# Patient Record
Sex: Male | Born: 1935 | Race: White | Hispanic: No | Marital: Married | Smoking: Former smoker
Health system: Southern US, Community
[De-identification: ages and names within clinical notes are randomized; demographics above are authoritative.]

## PROBLEM LIST (undated history)

## (undated) DIAGNOSIS — F329 Major depressive disorder, single episode, unspecified: Secondary | ICD-10-CM

## (undated) DIAGNOSIS — N200 Calculus of kidney: Secondary | ICD-10-CM

## (undated) DIAGNOSIS — E785 Hyperlipidemia, unspecified: Secondary | ICD-10-CM

## (undated) DIAGNOSIS — A4101 Sepsis due to Methicillin susceptible Staphylococcus aureus: Secondary | ICD-10-CM

## (undated) DIAGNOSIS — N138 Other obstructive and reflux uropathy: Secondary | ICD-10-CM

## (undated) DIAGNOSIS — K7689 Other specified diseases of liver: Secondary | ICD-10-CM

## (undated) DIAGNOSIS — M7021 Olecranon bursitis, right elbow: Secondary | ICD-10-CM

## (undated) DIAGNOSIS — I714 Abdominal aortic aneurysm, without rupture, unspecified: Secondary | ICD-10-CM

## (undated) DIAGNOSIS — R972 Elevated prostate specific antigen [PSA]: Secondary | ICD-10-CM

## (undated) DIAGNOSIS — I719 Aortic aneurysm of unspecified site, without rupture: Secondary | ICD-10-CM

## (undated) DIAGNOSIS — B9561 Methicillin susceptible Staphylococcus aureus infection as the cause of diseases classified elsewhere: Secondary | ICD-10-CM

## (undated) DIAGNOSIS — Z8679 Personal history of other diseases of the circulatory system: Secondary | ICD-10-CM

## (undated) DIAGNOSIS — F0390 Unspecified dementia without behavioral disturbance: Secondary | ICD-10-CM

## (undated) DIAGNOSIS — R269 Unspecified abnormalities of gait and mobility: Secondary | ICD-10-CM

## (undated) DIAGNOSIS — N12 Tubulo-interstitial nephritis, not specified as acute or chronic: Secondary | ICD-10-CM

## (undated) DIAGNOSIS — R7881 Bacteremia: Secondary | ICD-10-CM

## (undated) DIAGNOSIS — G4700 Insomnia, unspecified: Secondary | ICD-10-CM

## (undated) DIAGNOSIS — R011 Cardiac murmur, unspecified: Secondary | ICD-10-CM

## (undated) DIAGNOSIS — J984 Other disorders of lung: Secondary | ICD-10-CM

## (undated) DIAGNOSIS — N401 Enlarged prostate with lower urinary tract symptoms: Secondary | ICD-10-CM

## (undated) DIAGNOSIS — N281 Cyst of kidney, acquired: Secondary | ICD-10-CM

## (undated) DIAGNOSIS — N201 Calculus of ureter: Secondary | ICD-10-CM

## (undated) DIAGNOSIS — R350 Frequency of micturition: Secondary | ICD-10-CM

## (undated) DIAGNOSIS — S42023A Displaced fracture of shaft of unspecified clavicle, initial encounter for closed fracture: Secondary | ICD-10-CM

## (undated) DIAGNOSIS — Z95828 Presence of other vascular implants and grafts: Secondary | ICD-10-CM

## (undated) DIAGNOSIS — Z981 Arthrodesis status: Secondary | ICD-10-CM

## (undated) DIAGNOSIS — Z87898 Personal history of other specified conditions: Secondary | ICD-10-CM

## (undated) DIAGNOSIS — S2231XA Fracture of one rib, right side, initial encounter for closed fracture: Secondary | ICD-10-CM

## (undated) DIAGNOSIS — J441 Chronic obstructive pulmonary disease with (acute) exacerbation: Secondary | ICD-10-CM

## (undated) DIAGNOSIS — I2510 Atherosclerotic heart disease of native coronary artery without angina pectoris: Secondary | ICD-10-CM

## (undated) DIAGNOSIS — M71121 Other infective bursitis, right elbow: Secondary | ICD-10-CM

## (undated) HISTORY — DX: Unspecified abnormalities of gait and mobility: R26.9

## (undated) HISTORY — DX: Other specified diseases of liver: K76.89

## (undated) HISTORY — PX: LITHOTRIPSY: SUR834

## (undated) HISTORY — DX: Major depressive disorder, single episode, unspecified: F32.9

## (undated) HISTORY — DX: Cardiac murmur, unspecified: R01.1

## (undated) HISTORY — DX: Other infective bursitis, right elbow: M71.121

## (undated) HISTORY — DX: Other disorders of lung: J98.4

## (undated) HISTORY — DX: Sepsis due to methicillin susceptible Staphylococcus aureus: A41.01

## (undated) HISTORY — DX: Other obstructive and reflux uropathy: N13.8

## (undated) HISTORY — DX: Tubulo-interstitial nephritis, not specified as acute or chronic: N12

## (undated) HISTORY — DX: Unspecified dementia without behavioral disturbance: F03.90

## (undated) HISTORY — DX: Chronic obstructive pulmonary disease with (acute) exacerbation: J44.1

## (undated) HISTORY — DX: Personal history of other specified conditions: Z87.898

## (undated) HISTORY — DX: Elevated prostate specific antigen (PSA): R97.20

## (undated) HISTORY — DX: Displaced fracture of shaft of unspecified clavicle, initial encounter for closed fracture: S42.023A

## (undated) HISTORY — DX: Bacteremia: R78.81

## (undated) HISTORY — DX: Personal history of other diseases of the circulatory system: Z86.79

## (undated) HISTORY — DX: Olecranon bursitis, right elbow: M70.21

## (undated) HISTORY — DX: Arthrodesis status: Z98.1

## (undated) HISTORY — DX: Atherosclerotic heart disease of native coronary artery without angina pectoris: I25.10

## (undated) HISTORY — DX: Benign prostatic hyperplasia with lower urinary tract symptoms: N40.1

## (undated) HISTORY — DX: Calculus of ureter: N20.1

## (undated) HISTORY — DX: Presence of other vascular implants and grafts: Z95.828

## (undated) HISTORY — DX: Cyst of kidney, acquired: N28.1

## (undated) HISTORY — DX: Abdominal aortic aneurysm, without rupture: I71.4

## (undated) HISTORY — PX: APPENDECTOMY: SHX54

## (undated) HISTORY — DX: Methicillin susceptible Staphylococcus aureus infection as the cause of diseases classified elsewhere: B95.61

## (undated) HISTORY — DX: Calculus of kidney: N20.0

## (undated) HISTORY — PX: ABDOMINAL AORTIC ANEURYSM REPAIR: SUR1152

## (undated) HISTORY — DX: Fracture of one rib, right side, initial encounter for closed fracture: S22.31XA

## (undated) HISTORY — DX: Abdominal aortic aneurysm, without rupture, unspecified: I71.40

## (undated) HISTORY — DX: Insomnia, unspecified: G47.00

## (undated) HISTORY — DX: Frequency of micturition: R35.0

---

## 2002-08-22 ENCOUNTER — Encounter: Payer: Self-pay | Admitting: *Deleted

## 2002-08-22 ENCOUNTER — Emergency Department (HOSPITAL_COMMUNITY): Admission: EM | Admit: 2002-08-22 | Discharge: 2002-08-22 | Payer: Self-pay

## 2006-09-19 HISTORY — PX: CERVICAL DISC ARTHROPLASTY: SHX587

## 2008-09-19 HISTORY — PX: KIDNEY STONE SURGERY: SHX686

## 2009-10-29 ENCOUNTER — Ambulatory Visit: Payer: Self-pay | Admitting: Family Medicine

## 2009-10-29 DIAGNOSIS — E785 Hyperlipidemia, unspecified: Secondary | ICD-10-CM | POA: Insufficient documentation

## 2009-10-29 DIAGNOSIS — S61409A Unspecified open wound of unspecified hand, initial encounter: Secondary | ICD-10-CM | POA: Insufficient documentation

## 2010-10-19 NOTE — Assessment & Plan Note (Signed)
Summary: LACERATION TO HAND/KH   Vital Signs:  Patient Profile:   75 Years Old Male CC:      lac right hand last night working on a cabinet Height:     68 inches Weight:      165 pounds O2 Sat:      97 % O2 treatment:    Room Air Temp:     98.5 degrees F oral Pulse rate:   64 / minute Pulse rhythm:   regular Resp:     12 per minute BP sitting:   147 / 84  (right arm) Cuff size:   regular  Vitals Entered By: Emilio Math (October 29, 2009 11:05 AM)                  Current Allergies: No known allergies History of Present Illness Chief Complaint: lac right hand last night working on a cabinet History of Present Illness: CUT RIGHT HAND ON A CABINET LAST PM. WOUND IS OVER 18 HRS OLD. COULD HAVE USED SUTUREING. BLEEDING CONTROLLED. NO NUMBNESS OR TINGLING.   Current Meds LIPITOR 20 MG TABS (ATORVASTATIN CALCIUM)  UROXATRAL 10 MG XR24H-TAB (ALFUZOSIN HCL)  CEPHALEXIN 500 MG CAPS (CEPHALEXIN) 1 by mouth tid  REVIEW OF SYSTEMS Constitutional Symptoms      Denies fever, chills, night sweats, weight loss, weight gain, and fatigue.  Eyes       Denies change in vision, eye pain, eye discharge, glasses, contact lenses, and eye surgery. Ear/Nose/Throat/Mouth       Denies hearing loss/aids, change in hearing, ear pain, ear discharge, dizziness, frequent runny nose, frequent nose bleeds, sinus problems, sore throat, hoarseness, and tooth pain or bleeding.  Respiratory       Denies dry cough, productive cough, wheezing, shortness of breath, asthma, bronchitis, and emphysema/COPD.  Cardiovascular       Denies murmurs, chest pain, and tires easily with exhertion.    Gastrointestinal       Denies stomach pain, nausea/vomiting, diarrhea, constipation, blood in bowel movements, and indigestion. Genitourniary       Denies painful urination, kidney stones, and loss of urinary control. Neurological       Denies paralysis, seizures, and fainting/blackouts. Musculoskeletal       Denies  muscle pain, joint pain, joint stiffness, decreased range of motion, redness, swelling, muscle weakness, and gout.  Skin       Denies bruising, unusual mles/lumps or sores, and hair/skin or nail changes.  Psych       Denies mood changes, temper/anger issues, anxiety/stress, speech problems, depression, and sleep problems.  Past History:  Past Medical History: Hyperlipidemia  Past Surgical History: Neck surgery  Family History: Mother, Healthy Father,D@ 37 Stomach CA  Social History: Non-smoker ETOH-yes No Drugs Retired from [employer] Physical Exam Skin: 4 CM IRREG FULL SKIN THIKNESS LACERATION THENAR IMMINENCE FOR THE RIGHT HAND. BLEEDING CONTROLLED. N/V INTACT. ROM OF THE THUMB INTACT WOUND SOAKED IN HIBIACLEANS AND IRRIGATED. DUE TO THE AGE OF THE WOUND ELECT NOT TO SUTURE. STERISTRIPS APPLIED. KLING DRESSING.  Assessment New Problems: LACERATION, HAND, RIGHT (ICD-882.0) HYPERLIPIDEMIA (ICD-272.4)   Plan New Medications/Changes: CEPHALEXIN 500 MG CAPS (CEPHALEXIN) 1 by mouth tid  ##21 0 x 0, 10/29/2009, Marvis Moeller DO  New Orders: New Patient Level III [99203]   Prescriptions: CEPHALEXIN 500 MG CAPS (CEPHALEXIN) 1 by mouth tid  ##21 0 x 0   Entered and Authorized by:   Marvis Moeller DO   Signed by:   Marvis Moeller DO on 10/29/2009  Method used:   Print then Give to Patient   RxID:   [id number]   Patient Instructions: 1)  KEEP CLEAN AND DRY. MONITOR FOR SIGNS OF INECTION. WILL TAKE 2 WKS OR MORE TO HEAL. MINIMAL USE. FOLLOW UP IF ANY COMPLICATIONS.   Orders Added: 1)  New Patient Level III [99203]    Tetanus/Td Vaccine    Vaccine Type: Td    Site: left deltoid    Mfr: Decavac    Dose: 0.5 ml    Route: IM    Given by: Emilio Math    Exp. Date: 12/23/2010    Lot #: [id number]    VIS given: 08/07/07 version given October 29, 2009.

## 2011-02-04 LAB — HM COLONOSCOPY

## 2011-12-09 DIAGNOSIS — N138 Other obstructive and reflux uropathy: Secondary | ICD-10-CM

## 2011-12-09 HISTORY — DX: Other obstructive and reflux uropathy: N13.8

## 2012-06-18 DIAGNOSIS — E785 Hyperlipidemia, unspecified: Secondary | ICD-10-CM

## 2012-06-18 DIAGNOSIS — R011 Cardiac murmur, unspecified: Secondary | ICD-10-CM

## 2012-06-18 DIAGNOSIS — Z981 Arthrodesis status: Secondary | ICD-10-CM

## 2012-06-18 DIAGNOSIS — F32A Depression, unspecified: Secondary | ICD-10-CM

## 2012-06-18 HISTORY — DX: Depression, unspecified: F32.A

## 2012-06-18 HISTORY — DX: Cardiac murmur, unspecified: R01.1

## 2012-06-18 HISTORY — DX: Hyperlipidemia, unspecified: E78.5

## 2012-06-18 HISTORY — DX: Arthrodesis status: Z98.1

## 2013-11-11 DIAGNOSIS — N200 Calculus of kidney: Secondary | ICD-10-CM | POA: Diagnosis not present

## 2013-11-11 DIAGNOSIS — N401 Enlarged prostate with lower urinary tract symptoms: Secondary | ICD-10-CM | POA: Diagnosis not present

## 2013-11-11 DIAGNOSIS — R972 Elevated prostate specific antigen [PSA]: Secondary | ICD-10-CM | POA: Diagnosis not present

## 2013-11-11 DIAGNOSIS — N139 Obstructive and reflux uropathy, unspecified: Secondary | ICD-10-CM | POA: Diagnosis not present

## 2013-11-11 DIAGNOSIS — N138 Other obstructive and reflux uropathy: Secondary | ICD-10-CM | POA: Diagnosis not present

## 2013-12-03 DIAGNOSIS — E7800 Pure hypercholesterolemia, unspecified: Secondary | ICD-10-CM | POA: Diagnosis not present

## 2014-02-05 DIAGNOSIS — R21 Rash and other nonspecific skin eruption: Secondary | ICD-10-CM | POA: Diagnosis not present

## 2014-02-05 DIAGNOSIS — L299 Pruritus, unspecified: Secondary | ICD-10-CM | POA: Diagnosis not present

## 2014-02-26 DIAGNOSIS — L819 Disorder of pigmentation, unspecified: Secondary | ICD-10-CM | POA: Diagnosis not present

## 2014-03-05 DIAGNOSIS — Z79899 Other long term (current) drug therapy: Secondary | ICD-10-CM | POA: Diagnosis not present

## 2014-03-05 DIAGNOSIS — E7800 Pure hypercholesterolemia, unspecified: Secondary | ICD-10-CM | POA: Diagnosis not present

## 2014-03-11 DIAGNOSIS — I714 Abdominal aortic aneurysm, without rupture, unspecified: Secondary | ICD-10-CM | POA: Diagnosis not present

## 2014-03-11 DIAGNOSIS — Z48812 Encounter for surgical aftercare following surgery on the circulatory system: Secondary | ICD-10-CM | POA: Diagnosis not present

## 2014-03-11 DIAGNOSIS — I701 Atherosclerosis of renal artery: Secondary | ICD-10-CM | POA: Diagnosis not present

## 2014-03-11 DIAGNOSIS — Z9889 Other specified postprocedural states: Secondary | ICD-10-CM | POA: Diagnosis not present

## 2014-03-11 DIAGNOSIS — Z95828 Presence of other vascular implants and grafts: Secondary | ICD-10-CM | POA: Diagnosis not present

## 2014-03-12 DIAGNOSIS — E7800 Pure hypercholesterolemia, unspecified: Secondary | ICD-10-CM | POA: Diagnosis not present

## 2014-04-02 DIAGNOSIS — H2510 Age-related nuclear cataract, unspecified eye: Secondary | ICD-10-CM | POA: Diagnosis not present

## 2014-04-02 DIAGNOSIS — H524 Presbyopia: Secondary | ICD-10-CM | POA: Diagnosis not present

## 2014-06-10 DIAGNOSIS — E7800 Pure hypercholesterolemia, unspecified: Secondary | ICD-10-CM | POA: Diagnosis not present

## 2014-06-19 DIAGNOSIS — Z23 Encounter for immunization: Secondary | ICD-10-CM | POA: Diagnosis not present

## 2014-06-28 ENCOUNTER — Emergency Department (INDEPENDENT_AMBULATORY_CARE_PROVIDER_SITE_OTHER): Payer: Medicare Other

## 2014-06-28 ENCOUNTER — Encounter: Payer: Self-pay | Admitting: Emergency Medicine

## 2014-06-28 ENCOUNTER — Emergency Department (INDEPENDENT_AMBULATORY_CARE_PROVIDER_SITE_OTHER)
Admission: EM | Admit: 2014-06-28 | Discharge: 2014-06-28 | Disposition: A | Discharge status: Home / Self Care | Payer: Medicare Other | Source: Home / Self Care | Attending: Family Medicine | Admitting: Family Medicine

## 2014-06-28 DIAGNOSIS — M25421 Effusion, right elbow: Secondary | ICD-10-CM | POA: Diagnosis not present

## 2014-06-28 DIAGNOSIS — M25521 Pain in right elbow: Secondary | ICD-10-CM

## 2014-06-28 DIAGNOSIS — M7021 Olecranon bursitis, right elbow: Secondary | ICD-10-CM | POA: Diagnosis not present

## 2014-06-28 DIAGNOSIS — M71121 Other infective bursitis, right elbow: Secondary | ICD-10-CM

## 2014-06-28 DIAGNOSIS — Z23 Encounter for immunization: Secondary | ICD-10-CM

## 2014-06-28 DIAGNOSIS — W19XXXA Unspecified fall, initial encounter: Secondary | ICD-10-CM

## 2014-06-28 DIAGNOSIS — Y92009 Unspecified place in unspecified non-institutional (private) residence as the place of occurrence of the external cause: Secondary | ICD-10-CM

## 2014-06-28 HISTORY — DX: Aortic aneurysm of unspecified site, without rupture: I71.9

## 2014-06-28 HISTORY — DX: Hyperlipidemia, unspecified: E78.5

## 2014-06-28 HISTORY — DX: Calculus of kidney: N20.0

## 2014-06-28 MED ORDER — TETANUS-DIPHTH-ACELL PERTUSSIS 5-2.5-18.5 LF-MCG/0.5 IM SUSP
0.5000 mL | Freq: Once | INTRAMUSCULAR | Status: AC
  Administered 2014-06-28: 0.5 mL via INTRAMUSCULAR

## 2014-06-28 NOTE — Discharge Instructions (Signed)
Thank you for coming in today. Go directly to [facility] emergency room. Do not eat or drink anything.

## 2014-06-28 NOTE — ED Provider Notes (Signed)
Michael Dillon is a 78 y.o. male who presents to Urgent Care today for arm swelling and elbow discharge. Patient fell 3 weeks ago striking his elbow on the ground resulting in a laceration at the olecranon. He's had clear drainage from the laceration at the olecranon until yesterday. He tripped yesterday and fell again landing on his elbow. He notes a continuous serous sanguinous discharge from the olecranon at the same location. Additionally he notes some elbow pain and swelling distal to the elbow of his arm. He denies any wrist or hand pain weakness or numbness. No fevers or chills. No medications tried yet. He cannot recall his last tetanus vaccination.   Past Medical History  Diagnosis Date  . Aortic aneurysm     2012  . Hyperlipidemia   . Renal calculi    History  Substance Use Topics  . Smoking status: Never Smoker   . Smokeless tobacco: Not on file  . Alcohol Use: Yes   ROS as above Medications: No current facility-administered medications for this encounter.   Current Outpatient Prescriptions  Medication Sig Dispense Refill  . atorvastatin (LIPITOR) 20 MG tablet Take 20 mg by mouth daily.      . clopidogrel (PLAVIX) 75 MG tablet Take 75 mg by mouth daily.        Exam:  BP 136/71  Pulse 77  Temp(Src) 97.8 F (36.6 C) (Oral)  Resp 16  SpO2 95% Gen: Well NAD HEENT: EOMI,  MMM Lungs: Normal work of breathing. CTABL Heart: RRR no MRG Abd: NABS, Soft. Nondistended, Nontender Exts: Brisk capillary refill, warm and well perfused.  Right arm: Swollen from the elbow to the MCPs. Wrist motion and hand motion are intact. The wrist and hand are nontender. Grip strength is intact and normal. Pulses are intact distally. The forearm is nontender but swollen. No palpable areas of fluctuance or induration. The elbow has normal range of motion but it is swollen especially on the olecranon aspect. There is a small hole at the tip of the olecranon with expressible serosanguineous fluid.  There is mild tenderness in the area.   No results found for this or any previous visit (from the past 24 hour(s)). Dg Elbow Complete Right  06/28/2014   CLINICAL DATA:  Status post fall 3 weeks ago and last night with posterior right elbow pain.  EXAM: RIGHT ELBOW - COMPLETE 3+ VIEW  COMPARISON:  None.  FINDINGS: There is no evidence of fracture, dislocation, or joint effusion. There is no evidence of arthropathy or other focal bone abnormality. Soft tissues are unremarkable.  IMPRESSION: No acute fracture or dislocation.   Electronically Signed   By: Sherian ReinWei-Chen  Lin M.D.   On: 06/28/2014 15:34    Assessment and Plan: 78 y.o. male with infected traumatic olecranon bursitis of the right elbow. Patient has distal swelling and I am concerned about more extensive infection. I discussed the case with the on-call orthopedic surgeon [facility]. The surgeon recommends the patient go to the emergency room now for further evaluation and management.  Wound Culture is Pending.   Discussed warning signs or symptoms. Please see discharge instructions. Patient expresses understanding.     Michael BongEvan S Tyquan Carmickle, MD 06/28/14 [id number]

## 2014-06-28 NOTE — ED Notes (Signed)
Correction: Apparently right elbow laceration occurred 3 weeks ago when he tripped on curb and fell against concrete/asphalt. Has been drain ever since. Last Tdap unknown.

## 2014-06-28 NOTE — ED Notes (Signed)
Fell while going down steps last evening and awoke with noticeable swelling of right hand and forearm; somewhat painful when folded across chest to sleep last night; also lacerated right elbow and bumped head; twisted lower back.

## 2014-06-29 DIAGNOSIS — L03113 Cellulitis of right upper limb: Secondary | ICD-10-CM | POA: Diagnosis not present

## 2014-06-29 DIAGNOSIS — E785 Hyperlipidemia, unspecified: Secondary | ICD-10-CM | POA: Diagnosis not present

## 2014-06-29 DIAGNOSIS — Z87891 Personal history of nicotine dependence: Secondary | ICD-10-CM | POA: Diagnosis not present

## 2014-06-29 DIAGNOSIS — N401 Enlarged prostate with lower urinary tract symptoms: Secondary | ICD-10-CM | POA: Diagnosis present

## 2014-06-29 DIAGNOSIS — M7021 Olecranon bursitis, right elbow: Secondary | ICD-10-CM | POA: Diagnosis not present

## 2014-06-29 DIAGNOSIS — Z87442 Personal history of urinary calculi: Secondary | ICD-10-CM | POA: Diagnosis not present

## 2014-06-29 DIAGNOSIS — I10 Essential (primary) hypertension: Secondary | ICD-10-CM | POA: Diagnosis not present

## 2014-06-29 DIAGNOSIS — Z981 Arthrodesis status: Secondary | ICD-10-CM | POA: Diagnosis not present

## 2014-06-29 DIAGNOSIS — M71121 Other infective bursitis, right elbow: Secondary | ICD-10-CM | POA: Diagnosis not present

## 2014-06-29 DIAGNOSIS — L03115 Cellulitis of right lower limb: Secondary | ICD-10-CM | POA: Diagnosis not present

## 2014-06-29 DIAGNOSIS — R296 Repeated falls: Secondary | ICD-10-CM | POA: Diagnosis present

## 2014-06-29 DIAGNOSIS — B9689 Other specified bacterial agents as the cause of diseases classified elsewhere: Secondary | ICD-10-CM | POA: Diagnosis present

## 2014-06-30 DIAGNOSIS — E785 Hyperlipidemia, unspecified: Secondary | ICD-10-CM | POA: Diagnosis not present

## 2014-06-30 DIAGNOSIS — M71121 Other infective bursitis, right elbow: Secondary | ICD-10-CM | POA: Diagnosis not present

## 2014-06-30 DIAGNOSIS — M7021 Olecranon bursitis, right elbow: Secondary | ICD-10-CM | POA: Diagnosis not present

## 2014-06-30 DIAGNOSIS — L03113 Cellulitis of right upper limb: Secondary | ICD-10-CM | POA: Diagnosis not present

## 2014-06-30 DIAGNOSIS — I10 Essential (primary) hypertension: Secondary | ICD-10-CM | POA: Diagnosis not present

## 2014-07-01 DIAGNOSIS — M7021 Olecranon bursitis, right elbow: Secondary | ICD-10-CM | POA: Diagnosis not present

## 2014-07-01 DIAGNOSIS — L03113 Cellulitis of right upper limb: Secondary | ICD-10-CM | POA: Diagnosis not present

## 2014-07-01 DIAGNOSIS — L03115 Cellulitis of right lower limb: Secondary | ICD-10-CM | POA: Diagnosis not present

## 2014-07-02 LAB — WOUND CULTURE
GRAM STAIN: NONE SEEN
GRAM STAIN: NONE SEEN
Organism ID, Bacteria: NO GROWTH

## 2014-07-09 DIAGNOSIS — M7021 Olecranon bursitis, right elbow: Secondary | ICD-10-CM | POA: Diagnosis not present

## 2014-07-15 DIAGNOSIS — M7021 Olecranon bursitis, right elbow: Secondary | ICD-10-CM | POA: Diagnosis not present

## 2014-07-21 DIAGNOSIS — L905 Scar conditions and fibrosis of skin: Secondary | ICD-10-CM | POA: Diagnosis not present

## 2014-07-21 DIAGNOSIS — L309 Dermatitis, unspecified: Secondary | ICD-10-CM | POA: Diagnosis not present

## 2014-07-21 DIAGNOSIS — Z85828 Personal history of other malignant neoplasm of skin: Secondary | ICD-10-CM | POA: Diagnosis not present

## 2014-07-21 DIAGNOSIS — L815 Leukoderma, not elsewhere classified: Secondary | ICD-10-CM | POA: Diagnosis not present

## 2014-08-25 DIAGNOSIS — L570 Actinic keratosis: Secondary | ICD-10-CM | POA: Diagnosis not present

## 2014-09-03 DIAGNOSIS — Z0000 Encounter for general adult medical examination without abnormal findings: Secondary | ICD-10-CM | POA: Diagnosis not present

## 2014-09-03 DIAGNOSIS — Z125 Encounter for screening for malignant neoplasm of prostate: Secondary | ICD-10-CM | POA: Diagnosis not present

## 2014-09-03 DIAGNOSIS — E780 Pure hypercholesterolemia: Secondary | ICD-10-CM | POA: Diagnosis not present

## 2014-09-03 DIAGNOSIS — R5383 Other fatigue: Secondary | ICD-10-CM | POA: Diagnosis not present

## 2014-09-08 DIAGNOSIS — E780 Pure hypercholesterolemia: Secondary | ICD-10-CM | POA: Diagnosis not present

## 2014-09-08 DIAGNOSIS — Z0000 Encounter for general adult medical examination without abnormal findings: Secondary | ICD-10-CM | POA: Diagnosis not present

## 2014-09-23 DIAGNOSIS — I714 Abdominal aortic aneurysm, without rupture: Secondary | ICD-10-CM | POA: Diagnosis not present

## 2014-09-23 DIAGNOSIS — Z9889 Other specified postprocedural states: Secondary | ICD-10-CM | POA: Diagnosis not present

## 2014-09-23 DIAGNOSIS — Z95828 Presence of other vascular implants and grafts: Secondary | ICD-10-CM | POA: Diagnosis not present

## 2014-09-23 DIAGNOSIS — I701 Atherosclerosis of renal artery: Secondary | ICD-10-CM | POA: Diagnosis not present

## 2014-09-23 DIAGNOSIS — Z7982 Long term (current) use of aspirin: Secondary | ICD-10-CM | POA: Diagnosis not present

## 2014-10-15 DIAGNOSIS — N200 Calculus of kidney: Secondary | ICD-10-CM | POA: Insufficient documentation

## 2014-10-15 DIAGNOSIS — I2510 Atherosclerotic heart disease of native coronary artery without angina pectoris: Secondary | ICD-10-CM

## 2014-10-15 DIAGNOSIS — E785 Hyperlipidemia, unspecified: Secondary | ICD-10-CM | POA: Diagnosis not present

## 2014-10-15 HISTORY — DX: Atherosclerotic heart disease of native coronary artery without angina pectoris: I25.10

## 2014-11-17 DIAGNOSIS — I714 Abdominal aortic aneurysm, without rupture: Secondary | ICD-10-CM | POA: Diagnosis not present

## 2014-11-17 DIAGNOSIS — Z87438 Personal history of other diseases of male genital organs: Secondary | ICD-10-CM | POA: Diagnosis not present

## 2014-11-17 DIAGNOSIS — N281 Cyst of kidney, acquired: Secondary | ICD-10-CM | POA: Diagnosis not present

## 2014-11-17 DIAGNOSIS — R972 Elevated prostate specific antigen [PSA]: Secondary | ICD-10-CM | POA: Diagnosis not present

## 2014-11-17 DIAGNOSIS — N200 Calculus of kidney: Secondary | ICD-10-CM | POA: Diagnosis not present

## 2014-11-17 DIAGNOSIS — N400 Enlarged prostate without lower urinary tract symptoms: Secondary | ICD-10-CM | POA: Diagnosis not present

## 2014-11-17 DIAGNOSIS — F329 Major depressive disorder, single episode, unspecified: Secondary | ICD-10-CM | POA: Diagnosis not present

## 2014-11-17 DIAGNOSIS — Z7982 Long term (current) use of aspirin: Secondary | ICD-10-CM | POA: Diagnosis not present

## 2014-11-17 DIAGNOSIS — E785 Hyperlipidemia, unspecified: Secondary | ICD-10-CM | POA: Diagnosis not present

## 2014-11-17 DIAGNOSIS — Z87891 Personal history of nicotine dependence: Secondary | ICD-10-CM | POA: Diagnosis not present

## 2014-11-24 DIAGNOSIS — L570 Actinic keratosis: Secondary | ICD-10-CM | POA: Diagnosis not present

## 2015-04-06 DIAGNOSIS — H2513 Age-related nuclear cataract, bilateral: Secondary | ICD-10-CM | POA: Diagnosis not present

## 2015-04-06 DIAGNOSIS — H5203 Hypermetropia, bilateral: Secondary | ICD-10-CM | POA: Diagnosis not present

## 2015-04-06 DIAGNOSIS — Z135 Encounter for screening for eye and ear disorders: Secondary | ICD-10-CM | POA: Diagnosis not present

## 2015-04-06 DIAGNOSIS — H43811 Vitreous degeneration, right eye: Secondary | ICD-10-CM | POA: Diagnosis not present

## 2015-04-25 DIAGNOSIS — I9789 Other postprocedural complications and disorders of the circulatory system, not elsewhere classified: Secondary | ICD-10-CM | POA: Diagnosis not present

## 2015-04-25 DIAGNOSIS — K5900 Constipation, unspecified: Secondary | ICD-10-CM | POA: Diagnosis not present

## 2015-04-25 DIAGNOSIS — R4182 Altered mental status, unspecified: Secondary | ICD-10-CM | POA: Diagnosis not present

## 2015-04-25 DIAGNOSIS — R410 Disorientation, unspecified: Secondary | ICD-10-CM | POA: Diagnosis not present

## 2015-04-25 DIAGNOSIS — G9341 Metabolic encephalopathy: Secondary | ICD-10-CM | POA: Diagnosis not present

## 2015-04-25 DIAGNOSIS — E785 Hyperlipidemia, unspecified: Secondary | ICD-10-CM | POA: Diagnosis not present

## 2015-04-25 DIAGNOSIS — I714 Abdominal aortic aneurysm, without rupture: Secondary | ICD-10-CM | POA: Diagnosis not present

## 2015-04-25 DIAGNOSIS — I4510 Unspecified right bundle-branch block: Secondary | ICD-10-CM | POA: Diagnosis not present

## 2015-04-25 DIAGNOSIS — D72829 Elevated white blood cell count, unspecified: Secondary | ICD-10-CM | POA: Diagnosis not present

## 2015-04-25 DIAGNOSIS — K750 Abscess of liver: Secondary | ICD-10-CM | POA: Diagnosis not present

## 2015-04-25 DIAGNOSIS — R55 Syncope and collapse: Secondary | ICD-10-CM | POA: Diagnosis not present

## 2015-04-26 DIAGNOSIS — R55 Syncope and collapse: Secondary | ICD-10-CM | POA: Diagnosis not present

## 2015-04-26 DIAGNOSIS — I4510 Unspecified right bundle-branch block: Secondary | ICD-10-CM | POA: Diagnosis present

## 2015-04-26 DIAGNOSIS — D72829 Elevated white blood cell count, unspecified: Secondary | ICD-10-CM | POA: Diagnosis present

## 2015-04-26 DIAGNOSIS — K7689 Other specified diseases of liver: Secondary | ICD-10-CM | POA: Diagnosis not present

## 2015-04-26 DIAGNOSIS — E785 Hyperlipidemia, unspecified: Secondary | ICD-10-CM | POA: Diagnosis not present

## 2015-04-26 DIAGNOSIS — K769 Liver disease, unspecified: Secondary | ICD-10-CM | POA: Diagnosis not present

## 2015-04-26 DIAGNOSIS — Z87891 Personal history of nicotine dependence: Secondary | ICD-10-CM | POA: Diagnosis not present

## 2015-04-26 DIAGNOSIS — K5900 Constipation, unspecified: Secondary | ICD-10-CM | POA: Diagnosis not present

## 2015-04-26 DIAGNOSIS — I517 Cardiomegaly: Secondary | ICD-10-CM | POA: Diagnosis not present

## 2015-04-26 DIAGNOSIS — R109 Unspecified abdominal pain: Secondary | ICD-10-CM | POA: Diagnosis not present

## 2015-04-26 DIAGNOSIS — R410 Disorientation, unspecified: Secondary | ICD-10-CM | POA: Diagnosis present

## 2015-04-26 DIAGNOSIS — N400 Enlarged prostate without lower urinary tract symptoms: Secondary | ICD-10-CM | POA: Diagnosis not present

## 2015-04-26 DIAGNOSIS — N401 Enlarged prostate with lower urinary tract symptoms: Secondary | ICD-10-CM | POA: Diagnosis not present

## 2015-04-26 DIAGNOSIS — I9789 Other postprocedural complications and disorders of the circulatory system, not elsewhere classified: Secondary | ICD-10-CM | POA: Diagnosis present

## 2015-04-26 DIAGNOSIS — M25561 Pain in right knee: Secondary | ICD-10-CM | POA: Diagnosis present

## 2015-04-26 DIAGNOSIS — M179 Osteoarthritis of knee, unspecified: Secondary | ICD-10-CM | POA: Diagnosis not present

## 2015-04-26 DIAGNOSIS — R4182 Altered mental status, unspecified: Secondary | ICD-10-CM | POA: Diagnosis not present

## 2015-04-26 DIAGNOSIS — I272 Other secondary pulmonary hypertension: Secondary | ICD-10-CM | POA: Diagnosis not present

## 2015-04-26 DIAGNOSIS — N281 Cyst of kidney, acquired: Secondary | ICD-10-CM | POA: Diagnosis not present

## 2015-04-26 DIAGNOSIS — G9341 Metabolic encephalopathy: Secondary | ICD-10-CM | POA: Diagnosis present

## 2015-04-26 DIAGNOSIS — K750 Abscess of liver: Secondary | ICD-10-CM | POA: Diagnosis not present

## 2015-04-26 DIAGNOSIS — I361 Nonrheumatic tricuspid (valve) insufficiency: Secondary | ICD-10-CM | POA: Diagnosis not present

## 2015-05-13 DIAGNOSIS — I714 Abdominal aortic aneurysm, without rupture: Secondary | ICD-10-CM | POA: Diagnosis not present

## 2015-05-14 DIAGNOSIS — I2510 Atherosclerotic heart disease of native coronary artery without angina pectoris: Secondary | ICD-10-CM | POA: Diagnosis not present

## 2015-05-14 DIAGNOSIS — R410 Disorientation, unspecified: Secondary | ICD-10-CM | POA: Diagnosis not present

## 2015-05-14 DIAGNOSIS — M6281 Muscle weakness (generalized): Secondary | ICD-10-CM | POA: Diagnosis not present

## 2015-05-14 DIAGNOSIS — R471 Dysarthria and anarthria: Secondary | ICD-10-CM | POA: Diagnosis not present

## 2015-06-25 DIAGNOSIS — Z87891 Personal history of nicotine dependence: Secondary | ICD-10-CM | POA: Diagnosis not present

## 2015-06-25 DIAGNOSIS — Z79899 Other long term (current) drug therapy: Secondary | ICD-10-CM | POA: Diagnosis not present

## 2015-06-25 DIAGNOSIS — F070 Personality change due to known physiological condition: Secondary | ICD-10-CM | POA: Diagnosis not present

## 2015-06-25 DIAGNOSIS — F329 Major depressive disorder, single episode, unspecified: Secondary | ICD-10-CM | POA: Diagnosis not present

## 2015-06-25 DIAGNOSIS — R4689 Other symptoms and signs involving appearance and behavior: Secondary | ICD-10-CM | POA: Diagnosis not present

## 2015-06-25 DIAGNOSIS — R413 Other amnesia: Secondary | ICD-10-CM | POA: Diagnosis not present

## 2015-06-25 DIAGNOSIS — R531 Weakness: Secondary | ICD-10-CM | POA: Diagnosis not present

## 2015-06-25 DIAGNOSIS — R4189 Other symptoms and signs involving cognitive functions and awareness: Secondary | ICD-10-CM | POA: Diagnosis not present

## 2015-07-10 DIAGNOSIS — R4689 Other symptoms and signs involving appearance and behavior: Secondary | ICD-10-CM | POA: Diagnosis not present

## 2015-07-10 DIAGNOSIS — R9082 White matter disease, unspecified: Secondary | ICD-10-CM | POA: Diagnosis not present

## 2015-07-10 DIAGNOSIS — R4189 Other symptoms and signs involving cognitive functions and awareness: Secondary | ICD-10-CM | POA: Diagnosis not present

## 2015-07-16 DIAGNOSIS — Z23 Encounter for immunization: Secondary | ICD-10-CM | POA: Diagnosis not present

## 2015-07-27 DIAGNOSIS — L570 Actinic keratosis: Secondary | ICD-10-CM | POA: Diagnosis not present

## 2015-07-27 DIAGNOSIS — Z85828 Personal history of other malignant neoplasm of skin: Secondary | ICD-10-CM | POA: Diagnosis not present

## 2015-07-27 DIAGNOSIS — L814 Other melanin hyperpigmentation: Secondary | ICD-10-CM | POA: Diagnosis not present

## 2015-07-27 DIAGNOSIS — L905 Scar conditions and fibrosis of skin: Secondary | ICD-10-CM | POA: Diagnosis not present

## 2015-09-23 DIAGNOSIS — I714 Abdominal aortic aneurysm, without rupture: Secondary | ICD-10-CM | POA: Diagnosis not present

## 2015-09-23 DIAGNOSIS — T82858A Stenosis of vascular prosthetic devices, implants and grafts, initial encounter: Secondary | ICD-10-CM | POA: Diagnosis not present

## 2015-09-23 DIAGNOSIS — I701 Atherosclerosis of renal artery: Secondary | ICD-10-CM | POA: Diagnosis not present

## 2015-09-23 DIAGNOSIS — Z95828 Presence of other vascular implants and grafts: Secondary | ICD-10-CM | POA: Diagnosis not present

## 2015-09-25 DIAGNOSIS — R413 Other amnesia: Secondary | ICD-10-CM | POA: Diagnosis not present

## 2015-11-11 DIAGNOSIS — Z125 Encounter for screening for malignant neoplasm of prostate: Secondary | ICD-10-CM | POA: Diagnosis not present

## 2015-11-11 DIAGNOSIS — Z0000 Encounter for general adult medical examination without abnormal findings: Secondary | ICD-10-CM | POA: Diagnosis not present

## 2015-11-11 DIAGNOSIS — I2510 Atherosclerotic heart disease of native coronary artery without angina pectoris: Secondary | ICD-10-CM | POA: Diagnosis not present

## 2015-11-11 DIAGNOSIS — E785 Hyperlipidemia, unspecified: Secondary | ICD-10-CM | POA: Diagnosis not present

## 2015-11-11 DIAGNOSIS — R350 Frequency of micturition: Secondary | ICD-10-CM | POA: Diagnosis not present

## 2015-11-16 DIAGNOSIS — R972 Elevated prostate specific antigen [PSA]: Secondary | ICD-10-CM | POA: Diagnosis not present

## 2015-11-16 DIAGNOSIS — R350 Frequency of micturition: Secondary | ICD-10-CM | POA: Diagnosis not present

## 2015-11-16 DIAGNOSIS — N200 Calculus of kidney: Secondary | ICD-10-CM | POA: Diagnosis not present

## 2015-11-16 DIAGNOSIS — Z87438 Personal history of other diseases of male genital organs: Secondary | ICD-10-CM | POA: Diagnosis not present

## 2015-12-30 DIAGNOSIS — E785 Hyperlipidemia, unspecified: Secondary | ICD-10-CM | POA: Diagnosis not present

## 2015-12-30 DIAGNOSIS — Z79899 Other long term (current) drug therapy: Secondary | ICD-10-CM | POA: Diagnosis not present

## 2015-12-30 DIAGNOSIS — R413 Other amnesia: Secondary | ICD-10-CM | POA: Diagnosis not present

## 2015-12-30 DIAGNOSIS — G3184 Mild cognitive impairment, so stated: Secondary | ICD-10-CM | POA: Diagnosis not present

## 2016-01-09 ENCOUNTER — Emergency Department (INDEPENDENT_AMBULATORY_CARE_PROVIDER_SITE_OTHER)
Admission: EM | Admit: 2016-01-09 | Discharge: 2016-01-09 | Disposition: A | Discharge status: Home / Self Care | Payer: Medicare Other | Source: Home / Self Care | Attending: Family Medicine | Admitting: Family Medicine

## 2016-01-09 ENCOUNTER — Encounter: Payer: Self-pay | Admitting: Emergency Medicine

## 2016-01-09 DIAGNOSIS — M7021 Olecranon bursitis, right elbow: Secondary | ICD-10-CM

## 2016-01-09 DIAGNOSIS — M71121 Other infective bursitis, right elbow: Secondary | ICD-10-CM

## 2016-01-09 MED ORDER — CLINDAMYCIN HCL 300 MG PO CAPS: 300.0000 mg | ORAL_CAPSULE | Freq: Four times a day (QID) | ORAL | Status: DC

## 2016-01-09 MED ORDER — CEFTRIAXONE SODIUM 1 G IJ SOLR
1.0000 g | Freq: Once | INTRAMUSCULAR | Status: AC
  Administered 2016-01-09: 1 g via INTRAMUSCULAR

## 2016-01-09 MED ORDER — ACETAMINOPHEN 325 MG PO TABS
650.0000 mg | ORAL_TABLET | Freq: Four times a day (QID) | ORAL | Status: DC | PRN
  Administered 2016-01-09: 650 mg via ORAL

## 2016-01-09 NOTE — Discharge Instructions (Signed)
Please come back tomorrow evening or first thing Monday morning for recheck of symptoms, whichever is more convenient for you.  The urgent care is open tomorrow from 11am-6pm and Monday 8am-8pm.  It is important that Michael Dillon takes his first 2 doses of oral antibiotics today.  Once when he get home and again before he goes to sleep.  Then he may divide the 4 doses throughout the day tomorrow and the next several days until antibiotics are completed unless changed by a medical provider.   Be sure to keep elbow clean with soap and water and use warm compresses such as damp warm washcloth for 15-20 minutes at a time, 2-3 times a day.

## 2016-01-09 NOTE — ED Provider Notes (Signed)
CSN: [id number]     Arrival date & time 01/09/16  1423 History   First MD Initiated Contact with Patient 01/09/16 1500     Chief Complaint  Patient presents with  . Extremity Laceration  . Joint Swelling   (Consider location/radiation/quality/duration/timing/severity/associated sxs/prior Treatment) HPI  The pt is a 80yo male [facility] by his wife for Right elbow pain, redness, and swelling. Pt has hx of mild memory loss, majority of information provided by pt's wife. Pt tripped and fell the other day at home onto his elbow and developed worsening redness and swelling or his Right elbow. Per medical records, pt has hx of similar symptoms with an infected Right elbow bursa 2 years ago. Per medical records, pt was [facility] at [facility] for [facility] antibiotics from 06/28/14-07/01/14, however, wife states she thinks that hospitalization was "overkill" and states pt was only admitted overnight, then sent home. Pt and wife requesting pt be prescribed antibiotics as they do not want to go to the hospital. No known fevers. No nausea or vomiting. Pt has been taken naproxen and tylenol, which provider moderate relief of the pain. No other injuries.    Past Medical History  Diagnosis Date  . Aortic aneurysm (HCC)     2012  . Hyperlipidemia   . Renal calculi    Past Surgical History  Procedure Laterality Date  . Abdominal aortic aneurysm repair    . Appendectomy    . Cervical disc arthroplasty     History reviewed. No pertinent family history. Social History  Substance Use Topics  . Smoking status: Never Smoker   . Smokeless tobacco: None  . Alcohol Use: Yes    Review of Systems  Constitutional: Negative for fever and chills.  Musculoskeletal: Positive for myalgias, joint swelling and arthralgias.       Right elbow  Skin: Positive for color change and wound.  Neurological: Negative for weakness and numbness.    Allergies  Review of patient's allergies indicates no known  allergies.  Home Medications   Prior to Admission medications   Medication Sig Start Date End Date Taking? Authorizing Provider  atorvastatin (LIPITOR) 20 MG tablet Take 20 mg by mouth daily.    Historical Provider, MD  clindamycin (CLEOCIN) 300 MG capsule Take 1 capsule (300 mg total) by mouth 4 (four) times daily. X 7 days 01/09/16   Junius FinnerErin O'Malley, PA-C  clopidogrel (PLAVIX) 75 MG tablet Take 75 mg by mouth daily.    Historical Provider, MD   Meds Ordered and Administered this Visit   Medications  acetaminophen (TYLENOL) tablet 650 mg (650 mg Oral Given 01/09/16 1525)  cefTRIAXone (ROCEPHIN) injection 1 g (1 g Intramuscular Given 01/09/16 1512)    BP 151/72 mmHg  Pulse 88  Temp(Src) 100.4 F (38 C) (Oral)  Wt 154 lb (69.854 kg)  SpO2 95% No data found.   Physical Exam  Constitutional: He is oriented to person, place, and time. He appears well-developed and well-nourished.  HENT:  Head: Normocephalic and atraumatic.  Mouth/Throat: Oropharynx is clear and moist.  Eyes: EOM are normal.  Neck: Normal range of motion.  Cardiovascular: Normal rate, regular rhythm and normal heart sounds.   Pulmonary/Chest: Effort normal and breath sounds normal. No respiratory distress. He has no wheezes. He has no rales.  Musculoskeletal: He exhibits edema and tenderness.  Right elbow: golf ball sized swelling to bursa, tender. Mildly limited flexion of elbow due to swelling. Full extension present.  Right arm: no tenderness to shoulder,  upper arm, forearm, or hand. 5/5 grip strength.   Neurological: He is alert and oriented to person, place, and time.  Skin: Skin is warm and dry. There is erythema.  Right elbow: erythema, warmth over bursa. Centralized shallow opening of skin, draining scant amount of serosanguinous fluid.   Psychiatric: He has a normal mood and affect. His behavior is normal.  Nursing note and vitals reviewed.   ED Course  Procedures (including critical care time)  Labs  Review Labs Reviewed - No data to display  Imaging Review No results found.    MDM   1. Infected olecranon bursa, right    Pt presenting to [facility] with Temp 100.4*F and infected Right elbow bursa. Hx of same in 2015, requiring admission to [facility] for IV antibiotics. Wife is adamant she does not want pt sent to hospital.   Tx in UC: Rocephin 1g IM and acetaminophen  PO  Rx: Clindamycin  QID for 7 days (advised to take first 2 doses today)  Strongly encouraged pt come back tomorrow afternoon or first thing Monday morning for recheck of symptoms. Pt and wife verbalized understanding and agreement with tx plan.     Junius Finner, PA-C 01/09/16 1549

## 2016-01-09 NOTE — ED Notes (Signed)
Pt c/o falling and landed on right elbow x1 week ago. Elbow swollen, red and warm.

## 2016-01-09 NOTE — ED Notes (Signed)
No adverse reaction noted to Rocephin injection

## 2016-01-11 ENCOUNTER — Encounter: Payer: Self-pay | Admitting: *Deleted

## 2016-01-11 ENCOUNTER — Emergency Department (INDEPENDENT_AMBULATORY_CARE_PROVIDER_SITE_OTHER)
Admission: EM | Admit: 2016-01-11 | Discharge: 2016-01-11 | Disposition: A | Discharge status: Home / Self Care | Payer: Medicare Other | Source: Home / Self Care

## 2016-01-11 ENCOUNTER — Ambulatory Visit (INDEPENDENT_AMBULATORY_CARE_PROVIDER_SITE_OTHER): Payer: Medicare Other | Admitting: Family Medicine

## 2016-01-11 DIAGNOSIS — M7021 Olecranon bursitis, right elbow: Secondary | ICD-10-CM

## 2016-01-11 DIAGNOSIS — B9689 Other specified bacterial agents as the cause of diseases classified elsewhere: Secondary | ICD-10-CM

## 2016-01-11 DIAGNOSIS — M71121 Other infective bursitis, right elbow: Secondary | ICD-10-CM

## 2016-01-11 NOTE — Progress Notes (Signed)
Michael Dillon is a 80 y.o. male who presents to [facility] today for right elbow swelling. Patient was seen in urgent care on April 22. At that time he had a persistent right elbow olecranon bursa that was nontender and nonpainful. However he had fallen on it recently resulted in a small laceration. The skin had become very red and tender. On the 22nd he was seen in urgent care diagnosed with probable septic olecranon bursitis and cellulitis. Additionally he had a mild fever of 100.35F. He was treated empirically with an intramuscular 1 g injection of ceftriaxone as well as oral clindamycin for 7 days. He notes near-complete resolution of pain and skin redness. The swelling on his elbow persists and is draining a serosanguineous fluid.   Past Medical History  Diagnosis Date  . Aortic aneurysm (HCC)     2012  . Hyperlipidemia   . Renal calculi    Past Surgical History  Procedure Laterality Date  . Abdominal aortic aneurysm repair    . Appendectomy    . Cervical disc arthroplasty     Social History  Substance Use Topics  . Smoking status: Never Smoker   . Smokeless tobacco: Not on file  . Alcohol Use: Yes   family history is not on file.  ROS:  No headache, visual changes, nausea, vomiting, diarrhea, constipation, dizziness, abdominal pain, skin rash, fevers, chills, night sweats, weight loss, swollen lymph nodes, body aches, joint swelling, muscle aches, chest pain, shortness of breath, mood changes, visual or auditory hallucinations.    Medications: Current Outpatient Prescriptions  Medication Sig Dispense Refill  . atorvastatin (LIPITOR) 20 MG tablet Take 20 mg by mouth daily.    . clindamycin (CLEOCIN) 300 MG capsule Take 1 capsule (300 mg total) by mouth 4 (four) times daily. X 7 days 28 capsule 0  . clopidogrel (PLAVIX) 75 MG tablet Take 75 mg by mouth daily.     No current facility-administered medications for this visit.   No  Known Allergies   Exam:  There were no vitals taken for this visit. General: Well Developed, well nourished, and in no acute distress.  Neuro/Psych: Alert and oriented x3, extra-ocular muscles intact, able to move all 4 extremities, sensation grossly intact. Skin: Warm and dry, no rashes noted.  Respiratory: Not using accessory muscles, speaking in full sentences, trachea midline.  Cardiovascular: Pulses palpable, no extremity edema. Abdomen: Does not appear distended. MSK: Right elbow large fluctuant erythematous mass at the olecranon approximately orange-sized. Mild skin erythema extending locally overlying the swelling. No extending erythema or induration. Serous sanguinous fluid is expressible from a small puncture wound at the tip of the olecranon. Elbow motion is normal.   Aspiration of septic olecranon bursitis. Consent obtained and timeout performed. Discussed the risks including worsening infection damage to underlying structures and bleeding. Patient and his wife both expressed understanding and agreement. The skin was cleaned with alcohol cold Spray applied and 1 mL of lidocaine was injected subcutaneously to achieve good anesthesia. The skin was again cleaned with alcohol and an 18-gauge needle was used to access the bursa. A small amount of pus was aspirated. There is difficulty achieving satisfactory aspiration with an 18-gauge needle. The needle was withdrawn and a large amount of pus and blood was expressed manually from the puncture wound. A dressing was applied. Patient tolerated procedure well. Pus sent for culture   No results found for this or any previous visit (from the past 24 hour(s)). No  results found.   80 year old male with septic conversion of existing persistent olecranon bursitis of the right elbow. Culture pending. Continue empiric clindamycin antibiotics. Follow-up with myself later this week. Additionally I have referred to orthopedic surgery for  evaluation and consultation of definitive surgical excision of this persistent now infected olecranon bursa.

## 2016-01-11 NOTE — Discharge Instructions (Signed)
Elbow Bursitis  Elbow bursitis is inflammation of the fluid-filled sac (bursa) between the tip of your elbow bone (olecranon) and your skin. Elbow bursitis may also be called olecranon bursitis.  Normally, the olecranon bursa has only a small amount of fluid in it to cushion and protect your elbow bone. Elbow bursitis causes fluid to build up inside the bursa. Over time, this swelling and inflammation can cause pain when you bend or lean on your elbow.   CAUSES  Elbow bursitis may be caused by:    Elbow injury (acute trauma).   Leaning on hard surfaces for long periods of time.   Infection from an injury that breaks the skin near your elbow.   A bone growth (spur) that forms at the tip of your elbow.   A medical condition that causes inflammation in your body, such as gout or rheumatoid arthritis.   The cause may also be unknown.   SIGNS AND SYMPTOMS   The first sign of elbow bursitis is usually swelling over the tip of your elbow. This can grow to be the size of a golf ball. This may start suddenly or develop gradually. You may also have:   Pain when bending or leaning on your elbow.   Restricted movement of your elbow.   If your bursitis is caused by an infection, symptoms may also include:   Redness, warmth, and tenderness of the elbow.   Drainage of pus from the swollen area over your elbow, if the skin breaks open.  DIAGNOSIS   Your health care provider may be able to diagnose elbow bursitis based on your signs and symptoms, especially if you have recently been injured. Your health care provider will also do a physical exam. This may include:   X-rays to look for a bone spur or a bone fracture.   Draining fluid from the bursa to test it for infection.   Blood tests to rule out gout or rheumatoid arthritis.  TREATMENT   Treatment for elbow bursitis depends on the cause. Treatment may include:   Medicines. These may include:    Over-the-counter medicines to relieve pain and inflammation.     Antibiotic medicines to fight infection.    Injections of anti-inflammatory medicines (steroids).   Wrapping your elbow with a bandage.   Draining fluid from the bursa.   Wearing elbow pads.   If your bursitis does not get better with treatment, surgery may be needed to remove the bursa.   HOME CARE INSTRUCTIONS    Take medicines only as directed by your health care provider.   If you were prescribed an antibiotic medicine, finish all of it even if you start to feel better.   If your bursitis is caused by an injury, rest your elbow and wear your bandage as directed by your health care provider. You may alsoapply ice to the injured area as directed by your health care provider:   Put ice in a plastic bag.   Place a towel between your skin and the bag.   Leave the ice on for 20 minutes, 2-3 times per day.   Avoid any activities that cause elbow pain.   Use elbow pads or elbow wraps to cushion your elbow.  SEEK MEDICAL CARE IF:   You have a fever.    Your symptoms do not get better with treatment.   Your pain or swelling gets worse.   Your elbow pain or swelling goes away and then returns.   You have   drainage of pus from the swollen area over your elbow.     This information is not intended to replace advice given to you by your health care provider. Make sure you discuss any questions you have with your health care provider.     Document Released: 10/05/2006 Document Revised: 09/26/2014 Document Reviewed: 05/14/2014  Elsevier Interactive Patient Education 2016 Elsevier Inc.

## 2016-01-11 NOTE — Patient Instructions (Signed)
Thank you for coming in today. Return to see me on Friday or sooner if needed.   Continue the antibiotics.  Return sooner if needed.   Elbow Bursitis A bursa is a fluid-filled sac that covers and protects a joint. Bursitis is when the fluid-filled sac gets puffy and sore (inflamed). Elbow bursitis, also called olecranon bursitis, happens over your elbow. This may be caused by:  Injury (acute trauma) to your elbow.   Leaning on hard surfaces for long periods of time.   Infection from an injury that breaks the skin near your elbow.  A bone growth (spur) that forms at the tip of your elbow.   A medical condition that causes inflammation in your body, such as:  Gout.  Rheumatoid arthritis. Sometimes the cause is not known. HOME CARE  Take medicines only as told by your doctor.  If you were prescribed an antibiotic medicine, finish all of it even if you start to feel better.   If your bursitis is caused by an injury, rest your elbow and wear your bandage as told by your doctor. You may also apply ice to the injured area as told by your doctor:   Put ice in a plastic bag.   Place a towel between your skin and the bag.   Leave the ice on for 20 minutes, 2-3 times per day.   Do not do any activities that cause pain to your elbow.  Use elbow pads or wraps to cushion your elbow.  GET HELP IF:  You have a fever.   Your symptoms do not get better with treatment.   Your pain or swelling gets worse.   Your pain or swelling goes away and then comes back.  You have drainage of pus from the swollen area over your elbow.   This information is not intended to replace advice given to you by your health care provider. Make sure you discuss any questions you have with your health care provider.   Document Released: 02/23/2010 Document Revised: 05/27/2015 Document Reviewed: 05/14/2014 Elsevier Interactive Patient Education [employer].

## 2016-01-11 NOTE — ED Notes (Signed)
Fayrene FearingJames is here today for a f/u of his RT elbow swelling. He reports that it is better today. No fever today.

## 2016-01-11 NOTE — ED Provider Notes (Signed)
CSN: [id number]     Arrival date & time 01/11/16  0807 History   None    Chief Complaint  Patient presents with  . Joint Swelling     HPI Comments: Patient seen for septic olecranon bursitis of right elbow two days ago.  After injection of Rocephin and beginning PO clindamycin, his fever and erythema have resolved.  He states that he is now having minimal pain, but elbow remains swollen.  Patient is a 80 y.o. male presenting with arm injury. The history is provided by the patient and the spouse.  Arm Injury Location:  Elbow Time since incident:  4 days Injury: yes   Mechanism of injury: fall   Fall:    Impact surface:  Hard floor   Point of impact: right elbow. Elbow location:  R elbow Pain details:    Quality:  Aching   Radiates to:  Does not radiate   Severity:  Mild   Onset quality:  Sudden   Duration:  4 days   Timing:  Constant   Progression:  Improving Chronicity:  Recurrent Prior injury to area:  Yes Relieved by: antibiotic. Associated symptoms: no fever     Past Medical History  Diagnosis Date  . Aortic aneurysm (HCC)     2012  . Hyperlipidemia   . Renal calculi    Past Surgical History  Procedure Laterality Date  . Abdominal aortic aneurysm repair    . Appendectomy    . Cervical disc arthroplasty     History reviewed. No pertinent family history. Social History  Substance Use Topics  . Smoking status: Never Smoker   . Smokeless tobacco: None  . Alcohol Use: Yes    Review of Systems  Constitutional: Negative for fever.  All other systems reviewed and are negative.   Allergies  Review of patient's allergies indicates no known allergies.  Home Medications   Prior to Admission medications   Medication Sig Start Date End Date Taking? Authorizing Provider  atorvastatin (LIPITOR) 20 MG tablet Take 20 mg by mouth daily.    Historical Provider, MD  clindamycin (CLEOCIN) 300 MG capsule Take 1 capsule (300 mg total) by mouth 4 (four) times daily. X 7  days 01/09/16   Junius Finner, PA-C  clopidogrel (PLAVIX) 75 MG tablet Take 75 mg by mouth daily.    Historical Provider, MD   Meds Ordered and Administered this Visit  Medications - No data to display  BP 145/78 mmHg  Pulse 77  Temp(Src) 98.5 F (36.9 C) (Oral)  Resp 18  Ht  (1.727 m)  Wt 171 lb (77.565 kg)  BMI 26.01 kg/m2  SpO2 95% No data found.   Physical Exam  Constitutional: He is oriented to person, place, and time. He appears well-developed and well-nourished. No distress.  HENT:  Head: Atraumatic.  Eyes: Pupils are equal, round, and reactive to light.  Musculoskeletal:       Right elbow: He exhibits normal range of motion. Tenderness found. Olecranon process tenderness noted.       Arms: Right olecranon bursa swollen and distended but not fluctuant.  There is a moist abrasion over mid portion of olecranon.  The bursa is mildly tender to palpation. There is mild erythema present that does not extend beyond the bursa.  Elbow has full range of motion.   Neurological: He is alert and oriented to person, place, and time.  Skin: Skin is warm and dry.  Nursing note and vitals reviewed.   ED Course  Procedures none    MDM   1. Septic olecranon bursitis of right elbow; improved    Will refer to Dr. Clementeen GrahamEvan Corey for aspiration of bursa and continued management.    Lattie HawStephen A Beese, MD 01/11/16 [phone]

## 2016-01-13 NOTE — Progress Notes (Signed)
Quick Note:  Culture of the fluid is growing Staph Bacteria. We are awaiting sensitivity results to make sure the antibiotic we picked will work.   HOWEVER: This problem is unlikely to go away completely without surgery. Please follow up ASAP with orthopedics as previously arranged. ______

## 2016-01-14 LAB — BODY FLUID CULTURE

## 2016-01-14 NOTE — Progress Notes (Signed)
Quick Note:  Culture shows bacteria sensitive to the antibiotic we picked. Please follow up with orthopedics. ______

## 2016-01-15 ENCOUNTER — Ambulatory Visit (INDEPENDENT_AMBULATORY_CARE_PROVIDER_SITE_OTHER): Payer: Medicare Other | Admitting: Family Medicine

## 2016-01-15 ENCOUNTER — Encounter: Payer: Self-pay | Admitting: Family Medicine

## 2016-01-15 VITALS — BP 142/77 | HR 74 | Temp 98.7°F | Wt 161.0 lb

## 2016-01-15 DIAGNOSIS — R296 Repeated falls: Secondary | ICD-10-CM | POA: Insufficient documentation

## 2016-01-15 DIAGNOSIS — M7021 Olecranon bursitis, right elbow: Secondary | ICD-10-CM

## 2016-01-15 DIAGNOSIS — R269 Unspecified abnormalities of gait and mobility: Secondary | ICD-10-CM | POA: Diagnosis not present

## 2016-01-15 DIAGNOSIS — M7989 Other specified soft tissue disorders: Secondary | ICD-10-CM | POA: Diagnosis not present

## 2016-01-15 DIAGNOSIS — B9689 Other specified bacterial agents as the cause of diseases classified elsewhere: Secondary | ICD-10-CM

## 2016-01-15 DIAGNOSIS — M71121 Other infective bursitis, right elbow: Secondary | ICD-10-CM

## 2016-01-15 DIAGNOSIS — M25521 Pain in right elbow: Secondary | ICD-10-CM | POA: Diagnosis not present

## 2016-01-15 HISTORY — DX: Unspecified abnormalities of gait and mobility: R26.9

## 2016-01-15 MED ORDER — CLINDAMYCIN HCL 300 MG PO CAPS: 300.0000 mg | ORAL_CAPSULE | Freq: Four times a day (QID) | ORAL | Status: DC

## 2016-01-15 NOTE — Patient Instructions (Signed)
Thank you for coming in today. I refilled the antibiotics.  Continue to take them.  Return as needed.  I prescribed a walker for help preventing falls.  Follow up with orthopedic surgery.

## 2016-01-15 NOTE — Progress Notes (Signed)
       Michael MinerJames Dillon is a 80 y.o. male who [facility] for follow-up septic bursitis. Patient was seen earlier this week for septic olecranon bursitis. He had aspiration and expression of purulent material from the right olecranon. Culture is growing staph aureus bacteria sensitive to clindamycin. He's been on clindamycin for about a week now. He notes no significant pain in the elbow but the swelling and bursa has returned. No fevers or chills.  Of note patient has a history of frequent falls. He has some undiagnosed neurocognitive disorder that will be formalized next week with an appointment with neurology.   Past Medical History  Diagnosis Date  . Aortic aneurysm (HCC)     2012  . Hyperlipidemia   . Renal calculi    Past Surgical History  Procedure Laterality Date  . Abdominal aortic aneurysm repair    . Appendectomy    . Cervical disc arthroplasty     Social History  Substance Use Topics  . Smoking status: Never Smoker   . Smokeless tobacco: Not on file  . Alcohol Use: Yes   family history is not on file.  ROS as above Medications: Current Outpatient Prescriptions  Medication Sig Dispense Refill  . atorvastatin (LIPITOR) 20 MG tablet Take 20 mg by mouth daily.    . clindamycin (CLEOCIN) 300 MG capsule Take 1 capsule (300 mg total) by mouth 4 (four) times daily. X 7 days 28 capsule 0  . clopidogrel (PLAVIX) 75 MG tablet Take 75 mg by mouth daily.    Marland Kitchen. oxybutynin (DITROPAN-XL) 5 MG 24 hr tablet      No current facility-administered medications for this visit.   No Known Allergies   Exam:  BP 142/77 mmHg  Pulse 74  Temp(Src) 98.7 F (37.1 C) (Oral)  Wt 161 lb (73.029 kg)  SpO2 95% Gen: Well NAD HEENT: EOMI,  MMM Lungs: Normal work of breathing. CTABL Heart: RRR no MRG Abd: NABS, Soft. Nondistended, Nontender Exts: Brisk capillary refill, warm and  well perfused. Right elbow with fluctuant erythematous bursa olecranon. Nontender. Skin is erythematous with no induration. No surrounding erythema. The proximal lateral radius area is swollen and tender likely due to fall. He has normal elbow motion. Normal shoulder motion. Gait: Abnormal gait with frequent right foot dragging.  No results found for this or any previous visit (from the past 24 hour(s)). No results found.   80 year old male with Right septic olecranon bursitis. This is returned despite drainage. Culture shows staph aureus bacteria sensitive to clindamycin which is currently on. Is eating existing established bursitis or several years before it became infected I am very concerned that this now septic bursitis will not resolve with drainage and oral antibiotics. I think he benefit from surgery. He is a follow-up appointment with orthopedic surgery today.  Frequent falls: Very likely due to currently undiagnosed neurocognitive disorder. He has an abnormal gait as well. I have prescribed a rolling walker and he will follow-up with his neurologist next week. I think he'd benefit from referral to physical therapy for gait analysis and gait training.

## 2016-01-16 DIAGNOSIS — Z87891 Personal history of nicotine dependence: Secondary | ICD-10-CM | POA: Diagnosis not present

## 2016-01-16 DIAGNOSIS — E785 Hyperlipidemia, unspecified: Secondary | ICD-10-CM | POA: Diagnosis not present

## 2016-01-16 DIAGNOSIS — R972 Elevated prostate specific antigen [PSA]: Secondary | ICD-10-CM | POA: Diagnosis not present

## 2016-01-16 DIAGNOSIS — M71121 Other infective bursitis, right elbow: Secondary | ICD-10-CM | POA: Diagnosis not present

## 2016-01-16 DIAGNOSIS — B9689 Other specified bacterial agents as the cause of diseases classified elsewhere: Secondary | ICD-10-CM | POA: Diagnosis not present

## 2016-01-16 DIAGNOSIS — Z7982 Long term (current) use of aspirin: Secondary | ICD-10-CM | POA: Diagnosis not present

## 2016-01-16 DIAGNOSIS — N401 Enlarged prostate with lower urinary tract symptoms: Secondary | ICD-10-CM | POA: Diagnosis not present

## 2016-01-16 DIAGNOSIS — Z79899 Other long term (current) drug therapy: Secondary | ICD-10-CM | POA: Diagnosis not present

## 2016-01-16 DIAGNOSIS — F329 Major depressive disorder, single episode, unspecified: Secondary | ICD-10-CM | POA: Diagnosis not present

## 2016-01-16 HISTORY — PX: REVISION TOTAL SHOULDER ARTHROPLASTY: SHX1040

## 2016-01-19 ENCOUNTER — Telehealth: Payer: Self-pay

## 2016-01-19 DIAGNOSIS — Z87891 Personal history of nicotine dependence: Secondary | ICD-10-CM | POA: Diagnosis not present

## 2016-01-19 DIAGNOSIS — E784 Other hyperlipidemia: Secondary | ICD-10-CM | POA: Diagnosis not present

## 2016-01-19 DIAGNOSIS — Z79899 Other long term (current) drug therapy: Secondary | ICD-10-CM | POA: Diagnosis not present

## 2016-01-19 DIAGNOSIS — M7021 Olecranon bursitis, right elbow: Secondary | ICD-10-CM | POA: Diagnosis not present

## 2016-01-19 NOTE — Telephone Encounter (Signed)
I think shoes that are a little less grippy are probably can be better. I definitely recommend seeing a physical therapist who focuses on gait training. Let me know if you need me to organize this.

## 2016-01-19 NOTE — Telephone Encounter (Signed)
Pts wife left a vm stating that you mentioned that he was wearing the wrong type of shoes. She would like to know what kind of shoes he should wear help prevent falls. Please advise.

## 2016-01-20 DIAGNOSIS — R339 Retention of urine, unspecified: Secondary | ICD-10-CM | POA: Diagnosis not present

## 2016-01-20 NOTE — Telephone Encounter (Signed)
Called and spoke with pts wife and advised her of recommendations. She states that the pt recently had surgery on his elbow and that this is not a good time to set up PT. She will call and let us know when she feels it will be a good time to proceed with the plan.

## 2016-01-22 DIAGNOSIS — W19XXXA Unspecified fall, initial encounter: Secondary | ICD-10-CM | POA: Diagnosis not present

## 2016-01-22 DIAGNOSIS — R2689 Other abnormalities of gait and mobility: Secondary | ICD-10-CM | POA: Diagnosis not present

## 2016-01-22 DIAGNOSIS — Z7902 Long term (current) use of antithrombotics/antiplatelets: Secondary | ICD-10-CM | POA: Diagnosis not present

## 2016-01-22 DIAGNOSIS — Z87891 Personal history of nicotine dependence: Secondary | ICD-10-CM | POA: Diagnosis not present

## 2016-01-22 DIAGNOSIS — I2510 Atherosclerotic heart disease of native coronary artery without angina pectoris: Secondary | ICD-10-CM | POA: Diagnosis not present

## 2016-01-22 DIAGNOSIS — R413 Other amnesia: Secondary | ICD-10-CM | POA: Diagnosis not present

## 2016-01-22 DIAGNOSIS — Z9889 Other specified postprocedural states: Secondary | ICD-10-CM | POA: Diagnosis not present

## 2016-01-22 DIAGNOSIS — Z9181 History of falling: Secondary | ICD-10-CM | POA: Diagnosis not present

## 2016-01-22 DIAGNOSIS — Z79899 Other long term (current) drug therapy: Secondary | ICD-10-CM | POA: Diagnosis not present

## 2016-01-22 DIAGNOSIS — R292 Abnormal reflex: Secondary | ICD-10-CM | POA: Diagnosis not present

## 2016-01-22 DIAGNOSIS — Z7982 Long term (current) use of aspirin: Secondary | ICD-10-CM | POA: Diagnosis not present

## 2016-01-22 DIAGNOSIS — G253 Myoclonus: Secondary | ICD-10-CM | POA: Diagnosis not present

## 2016-01-22 DIAGNOSIS — E784 Other hyperlipidemia: Secondary | ICD-10-CM | POA: Diagnosis not present

## 2016-01-22 DIAGNOSIS — M7021 Olecranon bursitis, right elbow: Secondary | ICD-10-CM | POA: Diagnosis not present

## 2016-01-28 DIAGNOSIS — R339 Retention of urine, unspecified: Secondary | ICD-10-CM | POA: Diagnosis not present

## 2016-01-29 DIAGNOSIS — Z7902 Long term (current) use of antithrombotics/antiplatelets: Secondary | ICD-10-CM | POA: Diagnosis not present

## 2016-01-29 DIAGNOSIS — Z79899 Other long term (current) drug therapy: Secondary | ICD-10-CM | POA: Diagnosis not present

## 2016-01-29 DIAGNOSIS — Z7982 Long term (current) use of aspirin: Secondary | ICD-10-CM | POA: Diagnosis not present

## 2016-01-29 DIAGNOSIS — Z87891 Personal history of nicotine dependence: Secondary | ICD-10-CM | POA: Diagnosis not present

## 2016-01-29 DIAGNOSIS — M7021 Olecranon bursitis, right elbow: Secondary | ICD-10-CM | POA: Diagnosis not present

## 2016-02-08 DIAGNOSIS — R2689 Other abnormalities of gait and mobility: Secondary | ICD-10-CM | POA: Diagnosis not present

## 2016-02-11 DIAGNOSIS — I2510 Atherosclerotic heart disease of native coronary artery without angina pectoris: Secondary | ICD-10-CM | POA: Diagnosis not present

## 2016-02-11 DIAGNOSIS — R300 Dysuria: Secondary | ICD-10-CM | POA: Diagnosis not present

## 2016-02-11 DIAGNOSIS — E785 Hyperlipidemia, unspecified: Secondary | ICD-10-CM | POA: Diagnosis not present

## 2016-02-18 DIAGNOSIS — B9561 Methicillin susceptible Staphylococcus aureus infection as the cause of diseases classified elsewhere: Secondary | ICD-10-CM

## 2016-02-18 DIAGNOSIS — R7881 Bacteremia: Secondary | ICD-10-CM

## 2016-02-18 HISTORY — DX: Methicillin susceptible Staphylococcus aureus infection as the cause of diseases classified elsewhere: B95.61

## 2016-02-18 HISTORY — DX: Bacteremia: R78.81

## 2016-02-27 DIAGNOSIS — Z981 Arthrodesis status: Secondary | ICD-10-CM | POA: Diagnosis not present

## 2016-02-27 DIAGNOSIS — M9971 Connective tissue and disc stenosis of intervertebral foramina of cervical region: Secondary | ICD-10-CM | POA: Diagnosis not present

## 2016-02-27 DIAGNOSIS — M4802 Spinal stenosis, cervical region: Secondary | ICD-10-CM | POA: Diagnosis not present

## 2016-02-27 DIAGNOSIS — G9589 Other specified diseases of spinal cord: Secondary | ICD-10-CM | POA: Diagnosis not present

## 2016-03-03 DIAGNOSIS — R339 Retention of urine, unspecified: Secondary | ICD-10-CM | POA: Diagnosis not present

## 2016-03-06 ENCOUNTER — Encounter: Payer: Self-pay | Admitting: Emergency Medicine

## 2016-03-06 ENCOUNTER — Emergency Department (INDEPENDENT_AMBULATORY_CARE_PROVIDER_SITE_OTHER)
Admission: EM | Admit: 2016-03-06 | Discharge: 2016-03-06 | Disposition: A | Discharge status: Other Acute Inpatient Hospital | Payer: Medicare Other | Source: Home / Self Care | Attending: Family Medicine | Admitting: Family Medicine

## 2016-03-06 DIAGNOSIS — R4182 Altered mental status, unspecified: Secondary | ICD-10-CM | POA: Diagnosis not present

## 2016-03-06 DIAGNOSIS — I499 Cardiac arrhythmia, unspecified: Secondary | ICD-10-CM | POA: Diagnosis not present

## 2016-03-06 DIAGNOSIS — R0600 Dyspnea, unspecified: Secondary | ICD-10-CM | POA: Diagnosis not present

## 2016-03-06 DIAGNOSIS — R402362 Coma scale, best motor response, obeys commands, at arrival to emergency department: Secondary | ICD-10-CM | POA: Diagnosis present

## 2016-03-06 DIAGNOSIS — I517 Cardiomegaly: Secondary | ICD-10-CM | POA: Diagnosis not present

## 2016-03-06 DIAGNOSIS — A4101 Sepsis due to Methicillin susceptible Staphylococcus aureus: Secondary | ICD-10-CM | POA: Diagnosis present

## 2016-03-06 DIAGNOSIS — Z79899 Other long term (current) drug therapy: Secondary | ICD-10-CM | POA: Diagnosis not present

## 2016-03-06 DIAGNOSIS — R531 Weakness: Secondary | ICD-10-CM

## 2016-03-06 DIAGNOSIS — N400 Enlarged prostate without lower urinary tract symptoms: Secondary | ICD-10-CM | POA: Diagnosis not present

## 2016-03-06 DIAGNOSIS — M25561 Pain in right knee: Secondary | ICD-10-CM

## 2016-03-06 DIAGNOSIS — R7881 Bacteremia: Secondary | ICD-10-CM | POA: Diagnosis not present

## 2016-03-06 DIAGNOSIS — Z95828 Presence of other vascular implants and grafts: Secondary | ICD-10-CM | POA: Diagnosis not present

## 2016-03-06 DIAGNOSIS — I714 Abdominal aortic aneurysm, without rupture: Secondary | ICD-10-CM | POA: Diagnosis not present

## 2016-03-06 DIAGNOSIS — I4510 Unspecified right bundle-branch block: Secondary | ICD-10-CM | POA: Diagnosis not present

## 2016-03-06 DIAGNOSIS — Z9889 Other specified postprocedural states: Secondary | ICD-10-CM | POA: Diagnosis not present

## 2016-03-06 DIAGNOSIS — J441 Chronic obstructive pulmonary disease with (acute) exacerbation: Secondary | ICD-10-CM | POA: Diagnosis not present

## 2016-03-06 DIAGNOSIS — M5134 Other intervertebral disc degeneration, thoracic region: Secondary | ICD-10-CM | POA: Diagnosis not present

## 2016-03-06 DIAGNOSIS — B9561 Methicillin susceptible Staphylococcus aureus infection as the cause of diseases classified elsewhere: Secondary | ICD-10-CM | POA: Diagnosis not present

## 2016-03-06 DIAGNOSIS — Z7401 Bed confinement status: Secondary | ICD-10-CM | POA: Diagnosis not present

## 2016-03-06 DIAGNOSIS — R402142 Coma scale, eyes open, spontaneous, at arrival to emergency department: Secondary | ICD-10-CM | POA: Diagnosis present

## 2016-03-06 DIAGNOSIS — E785 Hyperlipidemia, unspecified: Secondary | ICD-10-CM | POA: Diagnosis present

## 2016-03-06 DIAGNOSIS — Z9181 History of falling: Secondary | ICD-10-CM | POA: Diagnosis not present

## 2016-03-06 DIAGNOSIS — M25562 Pain in left knee: Secondary | ICD-10-CM

## 2016-03-06 DIAGNOSIS — R338 Other retention of urine: Secondary | ICD-10-CM | POA: Diagnosis not present

## 2016-03-06 DIAGNOSIS — Z8679 Personal history of other diseases of the circulatory system: Secondary | ICD-10-CM | POA: Diagnosis not present

## 2016-03-06 DIAGNOSIS — M25461 Effusion, right knee: Secondary | ICD-10-CM | POA: Diagnosis present

## 2016-03-06 DIAGNOSIS — N401 Enlarged prostate with lower urinary tract symptoms: Secondary | ICD-10-CM | POA: Diagnosis not present

## 2016-03-06 DIAGNOSIS — Z7982 Long term (current) use of aspirin: Secondary | ICD-10-CM | POA: Diagnosis not present

## 2016-03-06 DIAGNOSIS — J984 Other disorders of lung: Secondary | ICD-10-CM | POA: Diagnosis not present

## 2016-03-06 DIAGNOSIS — I776 Arteritis, unspecified: Secondary | ICD-10-CM | POA: Diagnosis not present

## 2016-03-06 DIAGNOSIS — J449 Chronic obstructive pulmonary disease, unspecified: Secondary | ICD-10-CM | POA: Diagnosis not present

## 2016-03-06 DIAGNOSIS — R5383 Other fatigue: Secondary | ICD-10-CM

## 2016-03-06 DIAGNOSIS — M4806 Spinal stenosis, lumbar region: Secondary | ICD-10-CM | POA: Diagnosis not present

## 2016-03-06 DIAGNOSIS — G92 Toxic encephalopathy: Secondary | ICD-10-CM | POA: Diagnosis present

## 2016-03-06 DIAGNOSIS — I081 Rheumatic disorders of both mitral and tricuspid valves: Secondary | ICD-10-CM | POA: Diagnosis not present

## 2016-03-06 DIAGNOSIS — N12 Tubulo-interstitial nephritis, not specified as acute or chronic: Secondary | ICD-10-CM | POA: Diagnosis not present

## 2016-03-06 DIAGNOSIS — R39198 Other difficulties with micturition: Secondary | ICD-10-CM

## 2016-03-06 DIAGNOSIS — M778 Other enthesopathies, not elsewhere classified: Secondary | ICD-10-CM | POA: Diagnosis present

## 2016-03-06 DIAGNOSIS — Z7902 Long term (current) use of antithrombotics/antiplatelets: Secondary | ICD-10-CM | POA: Diagnosis not present

## 2016-03-06 DIAGNOSIS — Z87891 Personal history of nicotine dependence: Secondary | ICD-10-CM | POA: Diagnosis not present

## 2016-03-06 DIAGNOSIS — Z66 Do not resuscitate: Secondary | ICD-10-CM | POA: Diagnosis present

## 2016-03-06 DIAGNOSIS — J9811 Atelectasis: Secondary | ICD-10-CM | POA: Diagnosis not present

## 2016-03-06 DIAGNOSIS — R339 Retention of urine, unspecified: Secondary | ICD-10-CM | POA: Diagnosis not present

## 2016-03-06 DIAGNOSIS — M5136 Other intervertebral disc degeneration, lumbar region: Secondary | ICD-10-CM | POA: Diagnosis not present

## 2016-03-06 DIAGNOSIS — I2510 Atherosclerotic heart disease of native coronary artery without angina pectoris: Secondary | ICD-10-CM | POA: Diagnosis not present

## 2016-03-06 DIAGNOSIS — Z955 Presence of coronary angioplasty implant and graft: Secondary | ICD-10-CM | POA: Diagnosis not present

## 2016-03-06 DIAGNOSIS — R296 Repeated falls: Secondary | ICD-10-CM | POA: Diagnosis not present

## 2016-03-06 DIAGNOSIS — R402242 Coma scale, best verbal response, confused conversation, at arrival to emergency department: Secondary | ICD-10-CM | POA: Diagnosis present

## 2016-03-06 DIAGNOSIS — T827XXD Infection and inflammatory reaction due to other cardiac and vascular devices, implants and grafts, subsequent encounter: Secondary | ICD-10-CM | POA: Diagnosis not present

## 2016-03-06 DIAGNOSIS — R350 Frequency of micturition: Secondary | ICD-10-CM | POA: Diagnosis not present

## 2016-03-06 DIAGNOSIS — Z043 Encounter for examination and observation following other accident: Secondary | ICD-10-CM | POA: Diagnosis not present

## 2016-03-06 DIAGNOSIS — R062 Wheezing: Secondary | ICD-10-CM | POA: Diagnosis not present

## 2016-03-06 DIAGNOSIS — T827XXA Infection and inflammatory reaction due to other cardiac and vascular devices, implants and grafts, initial encounter: Secondary | ICD-10-CM | POA: Diagnosis not present

## 2016-03-06 DIAGNOSIS — R011 Cardiac murmur, unspecified: Secondary | ICD-10-CM | POA: Diagnosis not present

## 2016-03-06 DIAGNOSIS — R488 Other symbolic dysfunctions: Secondary | ICD-10-CM | POA: Diagnosis not present

## 2016-03-06 DIAGNOSIS — M6281 Muscle weakness (generalized): Secondary | ICD-10-CM | POA: Diagnosis not present

## 2016-03-06 DIAGNOSIS — R2689 Other abnormalities of gait and mobility: Secondary | ICD-10-CM | POA: Diagnosis not present

## 2016-03-06 DIAGNOSIS — W19XXXA Unspecified fall, initial encounter: Secondary | ICD-10-CM | POA: Diagnosis not present

## 2016-03-06 DIAGNOSIS — F0390 Unspecified dementia without behavioral disturbance: Secondary | ICD-10-CM | POA: Diagnosis not present

## 2016-03-06 DIAGNOSIS — A419 Sepsis, unspecified organism: Secondary | ICD-10-CM | POA: Diagnosis not present

## 2016-03-06 DIAGNOSIS — M7989 Other specified soft tissue disorders: Secondary | ICD-10-CM | POA: Diagnosis not present

## 2016-03-06 DIAGNOSIS — R188 Other ascites: Secondary | ICD-10-CM | POA: Diagnosis not present

## 2016-03-06 DIAGNOSIS — R0602 Shortness of breath: Secondary | ICD-10-CM | POA: Diagnosis not present

## 2016-03-06 DIAGNOSIS — N390 Urinary tract infection, site not specified: Secondary | ICD-10-CM | POA: Diagnosis not present

## 2016-03-06 LAB — HEPATIC FUNCTION PANEL
ALT: 39 U/L (ref 10–40)
AST: 51 U/L — AB (ref 14–40)
Alkaline Phosphatase: 98 U/L (ref 25–125)
BILIRUBIN, TOTAL: 0.7 mg/dL

## 2016-03-06 LAB — BASIC METABOLIC PANEL
BUN: 13 mg/dL (ref 4–21)
CREATININE: 0.6 mg/dL (ref 0.6–1.3)
GLUCOSE: 95 mg/dL
Potassium: 3.8 mmol/L (ref 3.4–5.3)
Sodium: 136 mmol/L — AB (ref 137–147)

## 2016-03-06 LAB — CBC AND DIFFERENTIAL
HCT: 32 % — AB (ref 41–53)
Hemoglobin: 10.4 g/dL — AB (ref 13.5–17.5)
PLATELETS: 377 10*3/uL (ref 150–399)
WBC: 16.5 10*3/mL

## 2016-03-06 NOTE — ED Notes (Signed)
Patient using walker; wife gives history of him falling more than usual this past week; right knee is edematous.

## 2016-03-06 NOTE — ED Provider Notes (Signed)
CSN: [id number]     Arrival date & time 03/06/16  1129 History   First MD Initiated Contact with Patient 03/06/16 1206     Chief Complaint  Patient presents with  . Knee Pain   (Consider location/radiation/quality/duration/timing/severity/associated sxs/prior Treatment) HPI Michael Dillon is a 80 y.o. male presenting to UC with wife who provided majority of information, c/o bilateral knee pain that started last week after pt hit his knee a few times while getting an MRI of his back due to leg weakness.  Bilateral knee pain is aching and sore, 3/10 at this time.  MRI was normal, no signs of infection.  Pt has been using a walker at home since Tuesday, 03/01/16, and has falling a few times at home. Wife came home to find pt on floor on Tuesday after his first fall. Pt unsure how long he had been on the ground.  Wife notes pt appears weaker than normal and more fatigued.  Pt c/o knee pain but no other complaints. Pt doesn't know why he keeps falling. When asked if it is due to weakness or the pain, he states "I don't know."  Denies fever, chills, n/v/d. Denies dizziness, headaches or LOC.  He has been eating and drinking well.    Wife also notes pt had a urine catheter in place last week due to c/o difficulty urinating.  The catheter was removed on Wednesday, 03/02/16.  That night, pt had wet himself and had a large clot of blood in his urine.  Pt only seems to be able to urinate at night. Wife concerned pt may have an infection but does not want to take him to the hospital.  Wife states she plans on taking him to his family doctor in the morning.    Past Medical History  Diagnosis Date  . Aortic aneurysm (HCC)     2012  . Hyperlipidemia   . Renal calculi    Past Surgical History  Procedure Laterality Date  . Abdominal aortic aneurysm repair    . Appendectomy    . Cervical disc arthroplasty     History reviewed. No pertinent family history. Social History  Substance Use Topics  . Smoking  status: Never Smoker   . Smokeless tobacco: None  . Alcohol Use: Yes    Review of Systems  Constitutional: Positive for fatigue. Negative for fever, chills and appetite change.  Respiratory: Negative for shortness of breath and wheezing.   Cardiovascular: Negative for chest pain and palpitations.  Gastrointestinal: Negative for nausea, vomiting, abdominal pain, diarrhea and constipation.  Genitourinary: Positive for hematuria, decreased urine volume and difficulty urinating. Negative for dysuria, urgency, flank pain, scrotal swelling and penile pain.  Neurological: Positive for weakness ( generalized). Negative for dizziness, seizures, syncope, light-headedness, numbness and headaches.    Allergies  Review of patient's allergies indicates no known allergies.  Home Medications   Prior to Admission medications   Medication Sig Start Date End Date Taking? Authorizing Provider  albuterol (PROVENTIL) (2.5 MG/3ML) 0.083% nebulizer solution Take 2.5 mg by nebulization every 6 (six) hours as needed for wheezing or shortness of breath.   Yes Historical Provider, MD  alfuzosin (UROXATRAL) 10 MG 24 hr tablet Take 10 mg by mouth daily with breakfast.   Yes Historical Provider, MD  Apoaequorin (PREVAGEN) 10 MG CAPS Take by mouth.   Yes Historical Provider, MD  aspirin 81 MG tablet Take 81 mg by mouth daily.   Yes Historical Provider, MD  co-enzyme Q-10 30 MG capsule  Take 30 mg by mouth 3 (three) times daily.   Yes Historical Provider, MD  donepezil (ARICEPT) 10 MG tablet Take 10 mg by mouth at bedtime.   Yes Historical Provider, MD  atorvastatin (LIPITOR) 20 MG tablet Take 20 mg by mouth daily.    Historical Provider, MD  clindamycin (CLEOCIN) 300 MG capsule Take 1 capsule (300 mg total) by mouth 4 (four) times daily. X 7 days 01/15/16   Rodolph Bong, MD  clopidogrel (PLAVIX) 75 MG tablet Take 75 mg by mouth daily.    Historical Provider, MD  oxybutynin (DITROPAN-XL) 5 MG 24 hr tablet  01/13/16    Historical Provider, MD   Meds Ordered and Administered this Visit  Medications - No data to display  BP 131/65 mmHg  Pulse 76  Temp(Src) 98 F (36.7 C) (Oral)  Resp 18  Ht  (1.727 m)  Wt 156 lb (70.761 kg)  BMI 23.73 kg/m2  SpO2 96% No data found.   Physical Exam  Constitutional: He is oriented to person, place, and time. He appears lethargic. He is cooperative. He appears ill.  Thin elderly male sitting in exam chair. Appears lethargic.    HENT:  Head: Normocephalic and atraumatic.  Right Ear: Tympanic membrane normal.  Left Ear: Tympanic membrane normal.  Nose: Nose normal.  Mouth/Throat: Uvula is midline, oropharynx is clear and moist and mucous membranes are normal.  Eyes: Conjunctivae are normal. No scleral icterus.  Neck: Normal range of motion.  Cardiovascular: Normal rate, regular rhythm and normal heart sounds.   Pulmonary/Chest: Effort normal and breath sounds normal. No respiratory distress. He has no wheezes. He has no rales.  Abdominal: Soft. He exhibits no distension. There is no tenderness.  Soft, non-distended, non-tender.  Musculoskeletal: Normal range of motion. He exhibits edema and tenderness.  Right knee: moderate edema, worse to medial aspect. Tender. Left knee: no edema, diffuse tenderness. Full ROM  Neurological: He is oriented to person, place, and time. He appears lethargic.  Alert to person place and and time but slow to respond to questions, answers with just 1-2 words. Nods his head 'yes' or 'no'  Skin: Skin is warm and dry.  Bilateral knees: skin in tact. No erythema, warmth or ecchymosis.   Nursing note and vitals reviewed.   ED Course  Procedures (including critical care time)  Labs Review Labs Reviewed  POCT URINALYSIS DIP (MANUAL ENTRY)  POCT CBC W AUTO DIFF ([facility])    Imaging Review No results found.    MDM   1. Lethargic   2. Generalized weakness   3. Falls frequently   4. Bilateral knee pain   5.  Difficulty urinating    Pt initially presenting to [facility] with c/o bilateral knee pain, however majority of information provided by his wife.  Vitals are WNL- pt is afebrile, normal HR and BP, however, pt appears lethargic and ill. Concern for kidney infection with reports of recent urinary catheter and hematuria.  Concern weakness is causing pt to fall at home, which is new since Tuesday, per wife.    Attempted to get CBC and UA in UC as wife hesitant to go to emergency department. Unfortunately, pt was unable to provide urine sample and blood draw was insufficient for testing.   Wife agreeable to take pt to [facility] where all his records are. Wife feels comfortable taking pt POV. Pt stable. [facility] notified of pt's pending arrival.     Junius Finner, Cordelia Poche 03/06/16 1246

## 2016-03-16 DIAGNOSIS — R509 Fever, unspecified: Secondary | ICD-10-CM | POA: Diagnosis not present

## 2016-03-16 DIAGNOSIS — Z452 Encounter for adjustment and management of vascular access device: Secondary | ICD-10-CM | POA: Diagnosis not present

## 2016-03-16 DIAGNOSIS — Z7401 Bed confinement status: Secondary | ICD-10-CM | POA: Diagnosis not present

## 2016-03-16 DIAGNOSIS — S0181XA Laceration without foreign body of other part of head, initial encounter: Secondary | ICD-10-CM | POA: Diagnosis not present

## 2016-03-16 DIAGNOSIS — A4901 Methicillin susceptible Staphylococcus aureus infection, unspecified site: Secondary | ICD-10-CM | POA: Diagnosis not present

## 2016-03-16 DIAGNOSIS — W06XXXA Fall from bed, initial encounter: Secondary | ICD-10-CM | POA: Diagnosis not present

## 2016-03-16 DIAGNOSIS — Y999 Unspecified external cause status: Secondary | ICD-10-CM | POA: Diagnosis not present

## 2016-03-16 DIAGNOSIS — R011 Cardiac murmur, unspecified: Secondary | ICD-10-CM | POA: Diagnosis not present

## 2016-03-16 DIAGNOSIS — R7881 Bacteremia: Secondary | ICD-10-CM | POA: Diagnosis not present

## 2016-03-16 DIAGNOSIS — N400 Enlarged prostate without lower urinary tract symptoms: Secondary | ICD-10-CM | POA: Diagnosis not present

## 2016-03-16 DIAGNOSIS — T827XXD Infection and inflammatory reaction due to other cardiac and vascular devices, implants and grafts, subsequent encounter: Secondary | ICD-10-CM | POA: Diagnosis not present

## 2016-03-16 DIAGNOSIS — S2231XD Fracture of one rib, right side, subsequent encounter for fracture with routine healing: Secondary | ICD-10-CM | POA: Diagnosis not present

## 2016-03-16 DIAGNOSIS — Z87442 Personal history of urinary calculi: Secondary | ICD-10-CM | POA: Diagnosis not present

## 2016-03-16 DIAGNOSIS — I2510 Atherosclerotic heart disease of native coronary artery without angina pectoris: Secondary | ICD-10-CM | POA: Diagnosis not present

## 2016-03-16 DIAGNOSIS — S0191XA Laceration without foreign body of unspecified part of head, initial encounter: Secondary | ICD-10-CM | POA: Diagnosis not present

## 2016-03-16 DIAGNOSIS — M5136 Other intervertebral disc degeneration, lumbar region: Secondary | ICD-10-CM | POA: Diagnosis not present

## 2016-03-16 DIAGNOSIS — S0990XA Unspecified injury of head, initial encounter: Secondary | ICD-10-CM | POA: Diagnosis not present

## 2016-03-16 DIAGNOSIS — S199XXA Unspecified injury of neck, initial encounter: Secondary | ICD-10-CM | POA: Diagnosis not present

## 2016-03-16 DIAGNOSIS — Z79899 Other long term (current) drug therapy: Secondary | ICD-10-CM | POA: Diagnosis not present

## 2016-03-16 DIAGNOSIS — Y929 Unspecified place or not applicable: Secondary | ICD-10-CM | POA: Diagnosis not present

## 2016-03-16 DIAGNOSIS — E785 Hyperlipidemia, unspecified: Secondary | ICD-10-CM | POA: Diagnosis not present

## 2016-03-16 DIAGNOSIS — N390 Urinary tract infection, site not specified: Secondary | ICD-10-CM | POA: Diagnosis not present

## 2016-03-16 DIAGNOSIS — Z87891 Personal history of nicotine dependence: Secondary | ICD-10-CM | POA: Diagnosis not present

## 2016-03-16 DIAGNOSIS — Z7982 Long term (current) use of aspirin: Secondary | ICD-10-CM | POA: Diagnosis not present

## 2016-03-16 DIAGNOSIS — R2689 Other abnormalities of gait and mobility: Secondary | ICD-10-CM | POA: Diagnosis not present

## 2016-03-16 DIAGNOSIS — Z792 Long term (current) use of antibiotics: Secondary | ICD-10-CM | POA: Diagnosis not present

## 2016-03-16 DIAGNOSIS — F0390 Unspecified dementia without behavioral disturbance: Secondary | ICD-10-CM | POA: Diagnosis not present

## 2016-03-16 DIAGNOSIS — Z0000 Encounter for general adult medical examination without abnormal findings: Secondary | ICD-10-CM | POA: Diagnosis not present

## 2016-03-16 DIAGNOSIS — J441 Chronic obstructive pulmonary disease with (acute) exacerbation: Secondary | ICD-10-CM | POA: Diagnosis not present

## 2016-03-16 DIAGNOSIS — R259 Unspecified abnormal involuntary movements: Secondary | ICD-10-CM | POA: Diagnosis not present

## 2016-03-16 DIAGNOSIS — R4182 Altered mental status, unspecified: Secondary | ICD-10-CM | POA: Diagnosis not present

## 2016-03-16 DIAGNOSIS — Z960 Presence of urogenital implants: Secondary | ICD-10-CM | POA: Diagnosis not present

## 2016-03-16 DIAGNOSIS — R269 Unspecified abnormalities of gait and mobility: Secondary | ICD-10-CM | POA: Diagnosis not present

## 2016-03-16 DIAGNOSIS — R51 Headache: Secondary | ICD-10-CM | POA: Diagnosis not present

## 2016-03-16 DIAGNOSIS — N401 Enlarged prostate with lower urinary tract symptoms: Secondary | ICD-10-CM | POA: Diagnosis not present

## 2016-03-16 DIAGNOSIS — Z959 Presence of cardiac and vascular implant and graft, unspecified: Secondary | ICD-10-CM | POA: Diagnosis not present

## 2016-03-16 DIAGNOSIS — I714 Abdominal aortic aneurysm, without rupture: Secondary | ICD-10-CM | POA: Diagnosis not present

## 2016-03-16 DIAGNOSIS — F329 Major depressive disorder, single episode, unspecified: Secondary | ICD-10-CM | POA: Diagnosis not present

## 2016-03-16 DIAGNOSIS — R404 Transient alteration of awareness: Secondary | ICD-10-CM | POA: Diagnosis not present

## 2016-03-16 DIAGNOSIS — S0101XA Laceration without foreign body of scalp, initial encounter: Secondary | ICD-10-CM | POA: Diagnosis not present

## 2016-03-16 DIAGNOSIS — J984 Other disorders of lung: Secondary | ICD-10-CM | POA: Diagnosis not present

## 2016-03-16 DIAGNOSIS — M6281 Muscle weakness (generalized): Secondary | ICD-10-CM | POA: Diagnosis not present

## 2016-03-16 DIAGNOSIS — Z09 Encounter for follow-up examination after completed treatment for conditions other than malignant neoplasm: Secondary | ICD-10-CM | POA: Diagnosis not present

## 2016-03-16 DIAGNOSIS — A4101 Sepsis due to Methicillin susceptible Staphylococcus aureus: Secondary | ICD-10-CM | POA: Diagnosis not present

## 2016-03-16 DIAGNOSIS — I776 Arteritis, unspecified: Secondary | ICD-10-CM | POA: Diagnosis not present

## 2016-03-16 DIAGNOSIS — R531 Weakness: Secondary | ICD-10-CM | POA: Diagnosis not present

## 2016-03-16 DIAGNOSIS — R488 Other symbolic dysfunctions: Secondary | ICD-10-CM | POA: Diagnosis not present

## 2016-03-16 DIAGNOSIS — S2231XS Fracture of one rib, right side, sequela: Secondary | ICD-10-CM | POA: Diagnosis not present

## 2016-03-16 DIAGNOSIS — Y939 Activity, unspecified: Secondary | ICD-10-CM | POA: Diagnosis not present

## 2016-03-16 DIAGNOSIS — M549 Dorsalgia, unspecified: Secondary | ICD-10-CM | POA: Diagnosis not present

## 2016-03-16 DIAGNOSIS — N12 Tubulo-interstitial nephritis, not specified as acute or chronic: Secondary | ICD-10-CM | POA: Diagnosis not present

## 2016-03-16 DIAGNOSIS — S0190XD Unspecified open wound of unspecified part of head, subsequent encounter: Secondary | ICD-10-CM | POA: Diagnosis not present

## 2016-03-16 DIAGNOSIS — Z9181 History of falling: Secondary | ICD-10-CM | POA: Diagnosis not present

## 2016-03-16 DIAGNOSIS — D649 Anemia, unspecified: Secondary | ICD-10-CM | POA: Diagnosis not present

## 2016-03-16 DIAGNOSIS — B9562 Methicillin resistant Staphylococcus aureus infection as the cause of diseases classified elsewhere: Secondary | ICD-10-CM | POA: Diagnosis not present

## 2016-03-18 ENCOUNTER — Encounter: Payer: Self-pay | Admitting: Internal Medicine

## 2016-03-18 ENCOUNTER — Non-Acute Institutional Stay (SKILLED_NURSING_FACILITY): Payer: Medicare Other | Admitting: Internal Medicine

## 2016-03-18 DIAGNOSIS — N401 Enlarged prostate with lower urinary tract symptoms: Secondary | ICD-10-CM | POA: Diagnosis not present

## 2016-03-18 DIAGNOSIS — J984 Other disorders of lung: Secondary | ICD-10-CM | POA: Diagnosis not present

## 2016-03-18 DIAGNOSIS — I2510 Atherosclerotic heart disease of native coronary artery without angina pectoris: Secondary | ICD-10-CM | POA: Diagnosis not present

## 2016-03-18 DIAGNOSIS — F0390 Unspecified dementia without behavioral disturbance: Secondary | ICD-10-CM | POA: Diagnosis not present

## 2016-03-18 DIAGNOSIS — E785 Hyperlipidemia, unspecified: Secondary | ICD-10-CM | POA: Diagnosis not present

## 2016-03-18 DIAGNOSIS — A4101 Sepsis due to Methicillin susceptible Staphylococcus aureus: Secondary | ICD-10-CM

## 2016-03-18 DIAGNOSIS — N138 Other obstructive and reflux uropathy: Secondary | ICD-10-CM

## 2016-03-18 DIAGNOSIS — A4901 Methicillin susceptible Staphylococcus aureus infection, unspecified site: Secondary | ICD-10-CM

## 2016-03-18 NOTE — Progress Notes (Addendum)
MRN: [id number] Name: Michael Dillon  Sex: male Age: 80 y.o. DOB: 02-Jan-1936  PSC #:  [facility] Level Of Care: SNF Provider: Randon GoldsmithAnne D. Lyn HollingsheadAlexander, MD Emergency Contacts: Extended Emergency Contact Information Primary Emergency Contact: [facility],EUNICE Address: [address]          [place],  [id number] Home Phone: [phone] Relation: None  Code Status: DNR  Allergies: Review of patient's allergies indicates no known allergies.  Chief Complaint  Patient presents with  . New Admit To SNF    Admit to Facility    HPI: Patient is 80 y.o. male with HLD, COPD,AAA with graft in past who was [facility] where he was trx'ed for MSSA bacteremia 2/2 AAA graft infection, tx with oxacillen, gentamycin and rifampin. Pt will be needing LIFELONG prophylaxis. Pt is admitted to SNF for gneralized weakness. While at SNF pt will be followed for CAD, tx with ASA, plavix and lipitor, dementia, tx with aricept and HLD, tx with lipitor.  Past Medical History:  Diagnosis Date  . AAA (abdominal aortic aneurysm) (HCC)   . Aortic aneurysm (HCC)    2012  . Bacteremia due to Staphylococcus aureus   . Benign prostatic hypertrophy with lower urinary tract symptoms (LUTS)   . Calculus of kidney   . Calculus of ureter   . COPD exacerbation (HCC)   . Depression   . Elevated prostate specific antigen (PSA)   . H/O endovascular stent graft for abdominal aortic aneurysm   . Heart murmur   . History of fusion of cervical spine   . Hyperlipidemia   . Liver cyst   . Olecranon bursitis of right elbow   . Pyelonephritis   . Renal calculi   . Renal cyst   . Sepsis due to Methicillin susceptible Staphylococcus aureus (HCC)   . Septic olecranon bursitis of right elbow   . Urinary frequency     Past Surgical History:  Procedure Laterality Date  . ABDOMINAL AORTIC ANEURYSM REPAIR    . APPENDECTOMY    . CERVICAL DISC ARTHROPLASTY  2008  . KIDNEY STONE SURGERY  2010  .  LITHOTRIPSY    . REVISION TOTAL SHOULDER ARTHROPLASTY Right 01/16/16      Medication List       Accurate as of 03/18/16 11:59 PM. Always use your most recent med list.          albuterol (2.5 MG/3ML) 0.083% nebulizer solution Commonly known as:  PROVENTIL Take 2.5 mg by nebulization every 6 (six) hours as needed for wheezing or shortness of breath.   alfuzosin 10 MG 24 hr tablet Commonly known as:  UROXATRAL Take 10 mg by mouth at bedtime.   aspirin EC 81 MG tablet Take 81 mg by mouth daily.   atorvastatin 20 MG tablet Commonly known as:  LIPITOR Take 20 mg by mouth daily.   b complex vitamins capsule Take 1 capsule by mouth daily.   Bilberry 1000 MG Caps Take 1 capsule by mouth daily.   clindamycin 300 MG capsule Commonly known as:  CLEOCIN Take 300 mg by mouth 3 (three) times daily.   clopidogrel 75 MG tablet Commonly known as:  PLAVIX Take 75 mg by mouth daily.   Coenzyme Q-10 200 MG Caps Take 1 capsule by mouth daily.   donepezil 5 MG tablet Commonly known as:  ARICEPT Take 5 mg by mouth at bedtime.   Garlic 1000 MG Caps Take 1 capsule by mouth daily.   LIPO-FLAVONOID PLUS  Tabs Take by mouth. 200-100 mg tab daily       Meds ordered this encounter  Medications  . aspirin EC 81 MG tablet    Sig: Take 81 mg by mouth daily.  . Bilberry 1000 MG CAPS    Sig: Take 1 capsule by mouth daily.  Marland Kitchen. DISCONTD: clindamycin (CLEOCIN) 300 MG capsule    Sig: Take 300 mg by mouth 3 (three) times daily.  . Coenzyme Q-10 200 MG CAPS    Sig: Take 1 capsule by mouth daily.  Marland Kitchen. donepezil (ARICEPT) 5 MG tablet    Sig: Take 5 mg by mouth at bedtime.  . Garlic 1000 MG CAPS    Sig: Take 1 capsule by mouth daily.  . Vitamins-Lipotropics (LIPO-FLAVONOID PLUS) TABS    Sig: Take by mouth. 200-100 mg tab daily  . b complex vitamins capsule    Sig: Take 1 capsule by mouth daily.    Immunization History  Administered Date(s) Administered  . Tdap 06/28/2014    Social  History  Substance Use Topics  . Smoking status: Former Smoker    Packs/day: 1.00    Years: 40.00    Types: Cigarettes    Quit date: 12/08/1996  . Smokeless tobacco: Never Used  . Alcohol use 0.0 oz/week     Comment: 1-3 glasses of wine weekly    Family history is   Family History  Problem Relation Age of Onset  . Aortic aneurysm Mother   . Gastric cancer Father   . Stroke Father   . Lung disease Sister   . Hypertension Sister   . Sleep apnea Sister       Review of Systems  DATA OBTAINED: from patient, nurse GENERAL:  no fevers, fatigue, appetite changes SKIN: No itching, rash or wounds EYES: No eye pain, redness, discharge EARS: No earache, tinnitus, change in hearing NOSE: No congestion, drainage or bleeding  MOUTH/THROAT: No mouth or tooth pain, No sore throat RESPIRATORY: No cough, wheezing, SOB CARDIAC: No chest pain, palpitations, lower extremity edema  GI: No abdominal pain, No N/V/D or constipation, No heartburn or reflux  GU: No dysuria, frequency or urgency, or incontinence  MUSCULOSKELETAL: No unrelieved bone/joint pain NEUROLOGIC: No headache, dizziness or focal weakness PSYCHIATRIC: No c/o anxiety or sadness   Vitals:   03/18/16 0835  BP: 128/68  Pulse: 90  Resp: 20  Temp: 97.8 F (36.6 C)    SpO2 Readings from Last 1 Encounters:  04/05/16 98%        Physical Exam  GENERAL APPEARANCE: Alert, conversant,  No acute distress.  SKIN: No diaphoresis rash HEAD: Normocephalic, atraumatic  EYES: Conjunctiva/lids clear. Pupils round, reactive. EOMs intact.  EARS: External exam WNL, canals clear. Hearing grossly normal.  NOSE: No deformity or discharge.  MOUTH/THROAT: Lips w/o lesions  RESPIRATORY: Breathing is even, unlabored. Lung sounds are clear   CARDIOVASCULAR: Heart RRR no murmurs, rubs or gallops. No peripheral edema.   GASTROINTESTINAL: Abdomen is soft, non-tender, not distended w/ normal bowel sounds. GENITOURINARY: Bladder non tender,  not distended  MUSCULOSKELETAL: No abnormal joints or musculature NEUROLOGIC:  Cranial nerves 2-12 grossly intact. Moves all extremities  PSYCHIATRIC: Mood and affect appropriate to situation with dementia, no behavioral issues  Patient Active Problem List   Diagnosis Date Noted  . Right rib fracture 03/27/2016  . MSSA (methicillin susceptible Staphylococcus aureus) septicemia (HCC) 03/26/2016  . MSSA (methicillin susceptible Staphylococcus aureus) infection 03/26/2016  . Dementia without behavioral disturbance 03/26/2016  . Restrictive lung disease  03/26/2016  . Frequent falls 01/15/2016  . Abnormality of gait 01/15/2016  . Septic olecranon bursitis of right elbow 01/11/2016  . CAD in native artery 10/15/2014  . Calculus of kidney 10/15/2014  . Benign prostatic hyperplasia with urinary obstruction 12/09/2011  . Hyperlipidemia 10/29/2009  . LACERATION, HAND, RIGHT 10/29/2009       Component Value Date/Time   WBC 7.8 04/05/2016 0834   RBC 3.74 (L) 04/05/2016 0834   HGB 11.1 (L) 04/05/2016 0834   HCT 33.9 (L) 04/05/2016 0834   PLT 414 (H) 04/05/2016 0834   MCV 90.6 04/05/2016 0834   LYMPHSABS 1.3 04/05/2016 0834   MONOABS 0.6 04/05/2016 0834   EOSABS 0.1 04/05/2016 0834   BASOSABS 0.0 04/05/2016 0834        Component Value Date/Time   NA 136 04/05/2016 0834   NA 136 (A) 03/06/2016   K 4.4 04/05/2016 0834   CL 104 04/05/2016 0834   CO2 27 04/05/2016 0834   GLUCOSE 101 (H) 04/05/2016 0834   BUN 15 04/05/2016 0834   BUN 13 03/06/2016   CREATININE 0.81 04/05/2016 0834   CALCIUM 9.2 04/05/2016 0834   AST 51 (A) 03/06/2016   ALT 39 03/06/2016   ALKPHOS 98 03/06/2016   GFRNONAA >60 04/05/2016 0834   GFRAA >60 04/05/2016 0834    No results found for: HGBA1C  No results found for: CHOL, HDL, LDLCALC, LDLDIRECT, TRIG, CHOLHDL   No results found.  Not all labs, radiology exams or other studies done during hospitalization come through on my EPIC note; however they are  reviewed by me.    Assessment and Plan  MSSA (methicillin susceptible Staphylococcus aureus) septicemia (HCC) 2/2 AAA graft infection tx with oxacillin, gent and rifampin; per CTS no surgical intervention SNF - IV gentamycin for 10 more days; IV cefazolin and po rifampin for 6 more weeks , until 8/4  MSSA (methicillin susceptible Staphylococcus aureus) infection TX as above with rifampin, gent, oxacillen; no surgical intervention per CTS SNF - gentamycin IV for 9 more days; IV cephazolin and po rifampin for 6 more weeks, stop date 8/4  CAD in native artery SNF - stable;cont ASA, plavix coQ10 200 mg  Daily and lipitor.  Dementia without behavioral disturbance SNF - stable; cont aricept 5 mg nightly  Benign prostatic hyperplasia with urinary obstruction SNF - stable-cont alfuzosyn  Hyperlipidemia SNF - not stated as uncontrolled;cont lipitor 20 mg daily  Restrictive lung disease SNF - stable, cont proair HFA  Time spent > 45 min;> 50% of time with patient was spent reviewing records, labs, tests and studies, counseling and developing plan of care  Thurston Hole D. Lyn Hollingshead, MD

## 2016-03-21 ENCOUNTER — Encounter: Payer: Self-pay | Admitting: Internal Medicine

## 2016-03-21 ENCOUNTER — Non-Acute Institutional Stay (SKILLED_NURSING_FACILITY): Payer: Medicare Other | Admitting: Internal Medicine

## 2016-03-21 DIAGNOSIS — J984 Other disorders of lung: Secondary | ICD-10-CM

## 2016-03-21 DIAGNOSIS — A4901 Methicillin susceptible Staphylococcus aureus infection, unspecified site: Secondary | ICD-10-CM

## 2016-03-21 DIAGNOSIS — S2231XD Fracture of one rib, right side, subsequent encounter for fracture with routine healing: Secondary | ICD-10-CM

## 2016-03-21 NOTE — Progress Notes (Signed)
Patient ID: Michael Dillon, male   DOB: 09/09/1936, 80 y.o.   MRN: [id number] MRN: [id number] Name: Michael Dillon  Sex: male Age: 80 y.o. DOB: 1935-10-11  PSC #:  Facility/Room: [facility] / [facility] Level Of Care: SNF Provider: Randon Goldsmith. Lyn Hollingshead, MD Emergency Contacts: Extended Emergency Contact Information Primary Emergency Contact: [facility] Address: [address]          [place],  [address] Home Phone: [phone] Relation: None  Code Status: DNR  Allergies: Review of patient's allergies indicates no known allergies.  Chief Complaint  Patient presents with  . Acute Visit  Follow-up right rib fracture  HPI: Patient is 80 y.o. male with HLD, COPD,AAA with graft in past who was admitted to [facility] form 6/18-28 where he was trx'ed for MSSA bacteremia 2/2 AAA graft infection, tx with oxacillen, gentamycin and rifampin. Pt will be needing LIFELONG prophylaxis with clindamycin. Pt  admitted to SNF for gneralized weakness. While at SNF pt w followed for CAD, tx with ASA, plavix and lipitor, dementia, tx with aricept and HLD, tx with lipitor His stay here has been relatively unremarkable. Be doing well he does not show signs of sepsis he's been afebrile.  Ambulates about facility in his wheelchair.  Apparently while he was hospitalized a CT scan of his chest did incidentally show a right 10th posterior rib fracture-we have received a communication about this.  I did assess patient he appears to be stable in this regards he is not complaining of significant pain which is encouraging no shortness of breath or difficulty breathing has been noted he appears to be doing relatively well when relatively asymptomatic of this.  In regards to the bacteremia again he is receiving antibiotics has been afebrile appears to be stable in this regard as well  Past Medical History  Diagnosis Date  . Aortic aneurysm (HCC)     2012  . Hyperlipidemia   . Renal calculi   . Bacteremia due to  Staphylococcus aureus   . Sepsis due to Methicillin susceptible Staphylococcus aureus (HCC)   . COPD exacerbation (HCC)   . Pyelonephritis   . Septic olecranon bursitis of right elbow   . Liver cyst   . Olecranon bursitis of right elbow   . H/O endovascular stent graft for abdominal aortic aneurysm   . AAA (abdominal aortic aneurysm) (HCC)   . Heart murmur   . Depression   . History of fusion of cervical spine   . Urinary frequency   . Elevated prostate specific antigen (PSA)   . Benign prostatic hypertrophy with lower urinary tract symptoms (LUTS)   . Calculus of kidney   . Calculus of ureter   . Renal cyst     Past Surgical History  Procedure Laterality Date  . Abdominal aortic aneurysm repair    . Appendectomy    . Cervical disc arthroplasty  2008  . Kidney stone surgery  2010  . Lithotripsy    . Revision total shoulder arthroplasty Right 01/16/16      Medication List       This list is accurate as of: 03/21/16 11:59 PM.  Always use your most recent med list.               albuterol (2.5 MG/3ML) 0.083% nebulizer solution  Commonly known as:  PROVENTIL  Take 2.5 mg by nebulization every 6 (six) hours as needed for wheezing or shortness of breath.     alfuzosin 10 MG 24 hr tablet  Commonly known  as:  UROXATRAL  Take 10 mg by mouth at bedtime.     aspirin EC 81 MG tablet  Take 81 mg by mouth daily.     atorvastatin 20 MG tablet  Commonly known as:  LIPITOR  Take 20 mg by mouth daily.     b complex vitamins capsule  Take 1 capsule by mouth daily.     Bilberry 1000 MG Caps  Take 1 capsule by mouth daily.     clindamycin 300 MG capsule  Commonly known as:  CLEOCIN  Take 300 mg by mouth 3 (three) times daily.     clopidogrel 75 MG tablet  Commonly known as:  PLAVIX  Take 75 mg by mouth daily.     Coenzyme Q-10 200 MG Caps  Take 1 capsule by mouth daily.     donepezil 5 MG tablet  Commonly known as:  ARICEPT  Take 5 mg by mouth at bedtime.      Garlic 1000 MG Caps  Take 1 capsule by mouth daily.     LIPO-FLAVONOID PLUS Tabs  Take by mouth. 200-100 mg tab        No orders of the defined types were placed in this encounter.    Immunization History  Administered Date(s) Administered  . Tdap 06/28/2014    Social History  Substance Use Topics  . Smoking status: Former Smoker -- 1.00 packs/day for 40 years    Types: Cigarettes    Quit date: 12/08/1996  . Smokeless tobacco: Never Used  . Alcohol Use: 0.0 oz/week    0 Standard drinks or equivalent per week     Comment: 1-3 glasses of wine weekly    Family history is   Family History  Problem Relation Age of Onset  . Aortic aneurysm Mother   . Gastric cancer Father   . Stroke Father   . Lung disease Sister   . Hypertension Sister   . Sleep apnea Sister       Review of Systems  DATA OBTAINED: from patient, nurse GENERAL:  no fevers, fatigue, appetite changes SKIN: No itching, rash or wounds EYES: No eye pain, redness, discharge EARS: No earache, tinnitus, change in hearing NOSE: No congestion, drainage or bleeding  MOUTH/THROAT: No mouth or tooth pain, No sore throat RESPIRATORY: No cough, wheezing, SOB CARDIAC: No chest pain, palpitations, lower extremity edema  GI: No abdominal pain, No N/V/D or constipation, No heartburn or reflux  GU: No dysuria, frequency or urgency, or incontinence  MUSCULOSKELETAL: No unrelieved bone/joint pain NEUROLOGIC: No headache, dizziness or focal weakness PSYCHIATRIC: No c/o anxiety or sadness   Filed Vitals:   03/21/16 2228  BP: 128/73  Pulse: 70  Temp: 98 F (36.7 C)  Resp: 18    SpO2 Readings from Last 1 Encounters:  03/06/16 96%        Physical Exam  GENERAL APPEARANCE: Alert, conversant,  No acute distress.  SKIN: No diaphoresis rash--PICC-noted right upper arm HEAD: Normocephalic, atraumatic  EYES: Conjunctiva/lids clear. Pupils round, reactive. EOMs intact.  EARS: External exam WNL, canals clear.  Hearing grossly normal.  NOSE: No deformity or discharge.  MOUTH/THROAT: Lips w/o lesions  RESPIRATORY: Breathing is even, unlabored. Lung sounds are clear  no deformity noted in rib area CARDIOVASCULAR: Heart RRR no murmurs, rubs or gallops. Trace peripheral edema.   GASTROINTESTINAL: Abdomen is soft, non-tender, not distended w/ normal bowel sounds. GENITOURINARY: Bladder non tender, not distended  MUSCULOSKELETAL: No abnormal joints or musculature NEUROLOGIC:  Cranial nerves 2-12  grossly intact. Moves all extremities  PSYCHIATRIC: Mood and affect appropriate to situation with dementia, no behavioral issues  Patient Active Problem List   Diagnosis Date Noted  . Right rib fracture 03/27/2016  . MSSA (methicillin susceptible Staphylococcus aureus) septicemia (HCC) 03/26/2016  . MSSA (methicillin susceptible Staphylococcus aureus) infection 03/26/2016  . Dementia without behavioral disturbance 03/26/2016  . Restrictive lung disease 03/26/2016  . Frequent falls 01/15/2016  . Abnormality of gait 01/15/2016  . Septic olecranon bursitis of right elbow 01/11/2016  . CAD in native artery 10/15/2014  . Calculus of kidney 10/15/2014  . Benign prostatic hyperplasia with urinary obstruction 12/09/2011  . Hyperlipidemia 10/29/2009  . LACERATION, HAND, RIGHT 10/29/2009       Component Value Date/Time   WBC 16.5 03/06/2016   HGB 10.4* 03/06/2016   HCT 32* 03/06/2016   PLT 377 03/06/2016        Component Value Date/Time   NA 136* 03/06/2016   K 3.8 03/06/2016   BUN 13 03/06/2016   CREATININE 0.6 03/06/2016   AST 51* 03/06/2016   ALT 39 03/06/2016   ALKPHOS 98 03/06/2016    No results found for: HGBA1C  No results found for: CHOL, HDL, LDLCALC, LDLDIRECT, TRIG, CHOLHDL        Assessment and Plan #1 history of right 10th rib fracture-he appears to be asymptomatic pain appears to be controlled at this point will monitor I do not see any signs of distress here appears to be  doing well.Marland Kitchen.  He does have a history of restrictive lung disease is on nebulizers as needed again he appears to be tolerating this fracture without any complications which is encouraging  #2 history bacteremia again continues on antibiotic treatment this appears stable he's been afebrile appears to be doing well in this regard as well.  #3 history of dementia-he appears to be doing well here with supportive care he continues on Aricept.  Of note updated CBC and BMP are pending --note  recent white count appear to be within normal range  [id number]-of note greater than 25 minutes spent assessing patient-discussing his status with nursing staff-reviewing his chart-his labs-and coordinating and formulating a plan of care-of note greater than 50% of time spent coordinating plan of care

## 2016-03-26 ENCOUNTER — Encounter: Payer: Self-pay | Admitting: Internal Medicine

## 2016-03-26 DIAGNOSIS — J984 Other disorders of lung: Secondary | ICD-10-CM

## 2016-03-26 DIAGNOSIS — A4101 Sepsis due to Methicillin susceptible Staphylococcus aureus: Secondary | ICD-10-CM

## 2016-03-26 DIAGNOSIS — F0390 Unspecified dementia without behavioral disturbance: Secondary | ICD-10-CM | POA: Insufficient documentation

## 2016-03-26 DIAGNOSIS — A4901 Methicillin susceptible Staphylococcus aureus infection, unspecified site: Secondary | ICD-10-CM | POA: Insufficient documentation

## 2016-03-26 HISTORY — DX: Other disorders of lung: J98.4

## 2016-03-26 HISTORY — DX: Sepsis due to methicillin susceptible Staphylococcus aureus: A41.01

## 2016-03-26 HISTORY — DX: Unspecified dementia, unspecified severity, without behavioral disturbance, psychotic disturbance, mood disturbance, and anxiety: F03.90

## 2016-03-26 NOTE — Assessment & Plan Note (Addendum)
2/2 AAA graft infection tx with oxacillin, gent and rifampin; per CTS no surgical intervention SNF - IV gentamycin for 10 more days; IV cefazolin and po rifampin for 6 more weeks , until 8/4

## 2016-03-26 NOTE — Assessment & Plan Note (Signed)
SNF - stable-cont alfuzosyn

## 2016-03-26 NOTE — Assessment & Plan Note (Signed)
SNF - not stated as uncontrolled ; cont lipitor 20 mg daily 

## 2016-03-26 NOTE — Assessment & Plan Note (Addendum)
TX as above with rifampin, gent, oxacillen; no surgical intervention per CTS SNF - gentamycin IV for 9 more days; IV cephazolin and po rifampin for 6 more weeks, stop date 8/4

## 2016-03-26 NOTE — Assessment & Plan Note (Addendum)
SNF - stable;cont ASA, plavix coQ10 200 mg  Daily and lipitor.

## 2016-03-26 NOTE — Assessment & Plan Note (Signed)
SNF - stable; cont aricept 5 mg nightly

## 2016-03-26 NOTE — Assessment & Plan Note (Signed)
SNF - stable, cont proair HFA

## 2016-03-27 DIAGNOSIS — S2231XA Fracture of one rib, right side, initial encounter for closed fracture: Secondary | ICD-10-CM | POA: Insufficient documentation

## 2016-03-27 HISTORY — DX: Fracture of one rib, right side, initial encounter for closed fracture: S22.31XA

## 2016-03-30 LAB — BASIC METABOLIC PANEL
BUN: 13 mg/dL (ref 4–21)
Creatinine: 0.9 mg/dL (ref 0.6–1.3)
Glucose: 107 mg/dL
Potassium: 4.2 mmol/L (ref 3.4–5.3)
SODIUM: 136 mmol/L — AB (ref 137–147)

## 2016-04-05 ENCOUNTER — Encounter (HOSPITAL_COMMUNITY): Payer: Self-pay | Admitting: Emergency Medicine

## 2016-04-05 ENCOUNTER — Emergency Department (HOSPITAL_COMMUNITY)
Admission: EM | Admit: 2016-04-05 | Discharge: 2016-04-05 | Disposition: A | Discharge status: Home / Self Care | Payer: Medicare Other | Attending: Emergency Medicine | Admitting: Emergency Medicine

## 2016-04-05 ENCOUNTER — Emergency Department (HOSPITAL_COMMUNITY): Payer: Medicare Other

## 2016-04-05 DIAGNOSIS — Z87891 Personal history of nicotine dependence: Secondary | ICD-10-CM | POA: Insufficient documentation

## 2016-04-05 DIAGNOSIS — S0990XA Unspecified injury of head, initial encounter: Secondary | ICD-10-CM | POA: Diagnosis not present

## 2016-04-05 DIAGNOSIS — Y999 Unspecified external cause status: Secondary | ICD-10-CM | POA: Insufficient documentation

## 2016-04-05 DIAGNOSIS — Y939 Activity, unspecified: Secondary | ICD-10-CM | POA: Insufficient documentation

## 2016-04-05 DIAGNOSIS — Y929 Unspecified place or not applicable: Secondary | ICD-10-CM | POA: Insufficient documentation

## 2016-04-05 DIAGNOSIS — Z7982 Long term (current) use of aspirin: Secondary | ICD-10-CM | POA: Insufficient documentation

## 2016-04-05 DIAGNOSIS — W19XXXA Unspecified fall, initial encounter: Secondary | ICD-10-CM

## 2016-04-05 DIAGNOSIS — S199XXA Unspecified injury of neck, initial encounter: Secondary | ICD-10-CM | POA: Diagnosis not present

## 2016-04-05 DIAGNOSIS — Z79899 Other long term (current) drug therapy: Secondary | ICD-10-CM | POA: Insufficient documentation

## 2016-04-05 DIAGNOSIS — S0101XA Laceration without foreign body of scalp, initial encounter: Secondary | ICD-10-CM

## 2016-04-05 DIAGNOSIS — J441 Chronic obstructive pulmonary disease with (acute) exacerbation: Secondary | ICD-10-CM | POA: Insufficient documentation

## 2016-04-05 DIAGNOSIS — W06XXXA Fall from bed, initial encounter: Secondary | ICD-10-CM | POA: Insufficient documentation

## 2016-04-05 DIAGNOSIS — S0181XA Laceration without foreign body of other part of head, initial encounter: Secondary | ICD-10-CM | POA: Diagnosis not present

## 2016-04-05 DIAGNOSIS — E785 Hyperlipidemia, unspecified: Secondary | ICD-10-CM | POA: Insufficient documentation

## 2016-04-05 DIAGNOSIS — F329 Major depressive disorder, single episode, unspecified: Secondary | ICD-10-CM | POA: Insufficient documentation

## 2016-04-05 LAB — BASIC METABOLIC PANEL
Anion gap: 5 (ref 5–15)
BUN: 15 mg/dL (ref 6–20)
CO2: 27 mmol/L (ref 22–32)
Calcium: 9.2 mg/dL (ref 8.9–10.3)
Chloride: 104 mmol/L (ref 101–111)
Creatinine, Ser: 0.81 mg/dL (ref 0.61–1.24)
GFR calc Af Amer: >60 mL/min (ref >60)
GFR calc non Af Amer: >60 mL/min (ref >60)
GLUCOSE: 101 mg/dL — AB (ref 65–99)
POTASSIUM: 4.4 mmol/L (ref 3.5–5.1)
Sodium: 136 mmol/L (ref 135–145)

## 2016-04-05 LAB — CBC WITH DIFFERENTIAL/PLATELET
BASOS ABS: 0 10*3/uL (ref 0.0–0.1)
Basophils Relative: 0 %
Eosinophils Absolute: 0.1 10*3/uL (ref 0.0–0.7)
Eosinophils Relative: 1 %
HEMATOCRIT: 33.9 % — AB (ref 39.0–52.0)
Hemoglobin: 11.1 g/dL — ABNORMAL LOW (ref 13.0–17.0)
LYMPHS PCT: 17 %
Lymphs Abs: 1.3 10*3/uL (ref 0.7–4.0)
MCH: 29.7 pg (ref 26.0–34.0)
MCHC: 32.7 g/dL (ref 30.0–36.0)
MCV: 90.6 fL (ref 78.0–100.0)
MONO ABS: 0.6 10*3/uL (ref 0.1–1.0)
MONOS PCT: 7 %
NEUTROS ABS: 5.8 10*3/uL (ref 1.7–7.7)
NEUTROS PCT: 75 %
Platelets: 414 10*3/uL — ABNORMAL HIGH (ref 150–400)
RBC: 3.74 MIL/uL — ABNORMAL LOW (ref 4.22–5.81)
RDW: 16.4 % — ABNORMAL HIGH (ref 11.5–15.5)
WBC: 7.8 10*3/uL (ref 4.0–10.5)

## 2016-04-05 MED ORDER — LIDOCAINE-EPINEPHRINE 1 %-1:100000 IJ SOLN
20.0000 mL | Freq: Once | INTRAMUSCULAR | Status: AC
  Administered 2016-04-05: 20 mL
  Filled 2016-04-05: qty 1

## 2016-04-05 NOTE — Discharge Instructions (Signed)
Head Injury, Adult °You have received a head injury. It does not appear serious at this time. Headaches and vomiting are common following head injury. It should be easy to awaken from sleeping. Sometimes it is necessary for you to stay in the emergency department for a while for observation. Sometimes admission to the hospital may be needed. After injuries such as yours, most problems occur within the first 24 hours, but side effects may occur up to 7-10 days after the injury. It is important for you to carefully monitor your condition and contact your health care provider or seek immediate medical care if there is a change in your condition. °WHAT ARE THE TYPES OF HEAD INJURIES? °Head injuries can be as minor as a bump. Some head injuries can be more severe. More severe head injuries include: °· A jarring injury to the brain (concussion). °· A bruise of the brain (contusion). This mean there is bleeding in the brain that can cause swelling. °· A cracked skull (skull fracture). °· Bleeding in the brain that collects, clots, and forms a bump (hematoma). °WHAT CAUSES A HEAD INJURY? °A serious head injury is most likely to happen to someone who is in a car wreck and is not wearing a seat belt. Other causes of major head injuries include bicycle or motorcycle accidents, sports injuries, and falls. °HOW ARE HEAD INJURIES DIAGNOSED? °A complete history of the event leading to the injury and your current symptoms will be helpful in diagnosing head injuries. Many times, pictures of the brain, such as CT or MRI are needed to see the extent of the injury. Often, an overnight hospital stay is necessary for observation.  °WHEN SHOULD I SEEK IMMEDIATE MEDICAL CARE?  °You should get help right away if: °· You have confusion or drowsiness. °· You feel sick to your stomach (nauseous) or have continued, forceful vomiting. °· You have dizziness or unsteadiness that is getting worse. °· You have severe, continued headaches not  relieved by medicine. Only take over-the-counter or prescription medicines for pain, fever, or discomfort as directed by your health care provider. °· You do not have normal function of the arms or legs or are unable to walk. °· You notice changes in the black spots in the center of the colored part of your eye (pupil). °· You have a clear or bloody fluid coming from your nose or ears. °· You have a loss of vision. °During the next 24 hours after the injury, you must stay with someone who can watch you for the warning signs. This person should contact local emergency services (911 in the [place] if you have seizures, you become unconscious, or you are unable to wake up. °HOW CAN I PREVENT A HEAD INJURY IN THE FUTURE? °The most important factor for preventing major head injuries is avoiding motor vehicle accidents.  To minimize the potential for damage to your head, it is crucial to wear seat belts while riding in motor vehicles. Wearing helmets while bike riding and playing collision sports (like football) is also helpful. Also, avoiding dangerous activities around the house will further help reduce your risk of head injury.  °WHEN CAN I RETURN TO NORMAL ACTIVITIES AND ATHLETICS? °You should be reevaluated by your health care provider before returning to these activities. If you have any of the following symptoms, you should not return to activities or contact sports until 1 week after the symptoms have stopped: °· Persistent headache. °· Dizziness or vertigo. °· Poor attention and concentration. °· Confusion. °·   Memory problems. °· Nausea or vomiting. °· Fatigue or tire easily. °· Irritability. °· Intolerant of bright lights or loud noises. °· Anxiety or depression. °· Disturbed sleep. °MAKE SURE YOU:  °· Understand these instructions. °· Will watch your condition. °· Will get help right away if you are not doing well or get worse. °  °This information is not intended to replace advice given to you by your health  care provider. Make sure you discuss any questions you have with your health care provider. °  °Document Released: 09/05/2005 Document Revised: 09/26/2014 Document Reviewed: 05/13/2013 °Elsevier Interactive Patient Education ©2016 [employer] ° °Laceration Care, Adult °A laceration is a cut that goes through all of the layers of the skin and into the tissue that is right under the skin. Some lacerations heal on their own. Others need to be closed with stitches (sutures), staples, skin adhesive strips, or skin glue. Proper laceration care minimizes the risk of infection and helps the laceration to heal better. °HOW TO CARE FOR YOUR LACERATION °If sutures or staples were used: °· Keep the wound clean and dry. °· If you were given a bandage (dressing), you should change it at least one time per day or as told by your health care provider. You should also change it if it becomes wet or dirty. °· Keep the wound completely dry for the first 24 hours or as told by your health care provider. After that time, you may shower or bathe. However, make sure that the wound is not soaked in water until after the sutures or staples have been removed. °· Clean the wound one time each day or as told by your health care provider: °¨ Wash the wound with soap and water. °¨ Rinse the wound with water to remove all soap. °¨ Pat the wound dry with a clean towel. Do not rub the wound. °· After cleaning the wound, apply a thin layer of antibiotic ointment as told by your health care provider. This will help to prevent infection and keep the dressing from sticking to the wound. °· Have the sutures or staples removed as told by your health care provider. °If skin adhesive strips were used: °· Keep the wound clean and dry. °· If you were given a bandage (dressing), you should change it at least one time per day or as told by your health care provider. You should also change it if it becomes dirty or wet. °· Do not get the skin adhesive strips  wet. You may shower or bathe, but be careful to keep the wound dry. °· If the wound gets wet, pat it dry with a clean towel. Do not rub the wound. °· Skin adhesive strips fall off on their own. You may trim the strips as the wound heals. Do not remove skin adhesive strips that are still stuck to the wound. They will fall off in time. °If skin glue was used: °· Try to keep the wound dry, but you may briefly wet it in the shower or bath. Do not soak the wound in water, such as by swimming. °· After you have showered or bathed, gently pat the wound dry with a clean towel. Do not rub the wound. °· Do not do any activities that will make you sweat heavily until the skin glue has fallen off on its own. °· Do not apply liquid, cream, or ointment medicine to the wound while the skin glue is in place. Using those may loosen the film before   the wound has healed. °· If you were given a bandage (dressing), you should change it at least one time per day or as told by your health care provider. You should also change it if it becomes dirty or wet. °· If a dressing is placed over the wound, be careful not to apply tape directly over the skin glue. Doing that may cause the glue to be pulled off before the wound has healed. °· Do not pick at the glue. The skin glue usually remains in place for 5-10 days, then it falls off of the skin. °General Instructions °· Take over-the-counter and prescription medicines only as told by your health care provider. °· If you were prescribed an antibiotic medicine or ointment, take or apply it as told by your doctor. Do not stop using it even if your condition improves. °· To help prevent scarring, make sure to cover your wound with sunscreen whenever you are outside after stitches are removed, after adhesive strips are removed, or when glue remains in place and the wound is healed. Make sure to wear a sunscreen of at least 30 SPF. °· Do not scratch or pick at the wound. °· Keep all follow-up visits  as told by your health care provider. This is important. °· Check your wound every day for signs of infection. Watch for: °¨ Redness, swelling, or pain. °¨ Fluid, blood, or pus. °· Raise (elevate) the injured area above the level of your heart while you are sitting or lying down, if possible. °SEEK MEDICAL CARE IF: °· You received a tetanus shot and you have swelling, severe pain, redness, or bleeding at the injection site. °· You have a fever. °· A wound that was closed breaks open. °· You notice a bad smell coming from your wound or your dressing. °· You notice something coming out of the wound, such as wood or glass. °· Your pain is not controlled with medicine. °· You have increased redness, swelling, or pain at the site of your wound. °· You have fluid, blood, or pus coming from your wound. °· You notice a change in the color of your skin near your wound. °· You need to change the dressing frequently due to fluid, blood, or pus draining from the wound. °· You develop a new rash. °· You develop numbness around the wound. °SEEK IMMEDIATE MEDICAL CARE IF: °· You develop severe swelling around the wound. °· Your pain suddenly increases and is severe. °· You develop painful lumps near the wound or on skin that is anywhere on your body. °· You have a red streak going away from your wound. °· The wound is on your hand or foot and you cannot properly move a finger or toe. °· The wound is on your hand or foot and you notice that your fingers or toes look pale or bluish. °  °This information is not intended to replace advice given to you by your health care provider. Make sure you discuss any questions you have with your health care provider. °  °Document Released: 09/05/2005 Document Revised: 01/20/2015 Document Reviewed: 09/01/2014 °Elsevier Interactive Patient Education ©2016 [employer] ° ° °

## 2016-04-05 NOTE — ED Notes (Signed)
Bed: [id number] Expected date:  Expected time:  Means of arrival:  Comments: 80 yo M  Fall, head lac

## 2016-04-05 NOTE — ED Notes (Signed)
Patient states that he fell out of bed because he doesn't have any bed rails.  States he got up off floor and got back into bed.

## 2016-04-05 NOTE — ED Notes (Signed)
Per EMS pt from [facility] for evaluation of fall and laceration to posterior head. Pt denies any LOC and remembers incident. Pt stated he was walking on tile floor and slipped hitting posterior head on nightstand.

## 2016-04-05 NOTE — ED Provider Notes (Signed)
CSN: [id number]     Arrival date & time 04/05/16  0700 History   First MD Initiated Contact with Patient 04/05/16 [phone]     Chief Complaint  Patient presents with  . Fall     (Consider location/radiation/quality/duration/timing/severity/associated sxs/prior Treatment) HPI Patient reports he fell out of bed. He states he got back into bed and then he called nursing staff. Patient reports remembering the incident. This was unwitnessed. At this time he is denying headache or cervical pain. He denies pain or injury to his extremities or thorax. He reports he is on a blood thinner medication. He states he is currently in a nursing care for rehabilitation. Past Medical History  Diagnosis Date  . Aortic aneurysm (HCC)     2012  . Hyperlipidemia   . Renal calculi   . Bacteremia due to Staphylococcus aureus   . Sepsis due to Methicillin susceptible Staphylococcus aureus (HCC)   . COPD exacerbation (HCC)   . Pyelonephritis   . Septic olecranon bursitis of right elbow   . Liver cyst   . Olecranon bursitis of right elbow   . H/O endovascular stent graft for abdominal aortic aneurysm   . AAA (abdominal aortic aneurysm) (HCC)   . Heart murmur   . Depression   . History of fusion of cervical spine   . Urinary frequency   . Elevated prostate specific antigen (PSA)   . Benign prostatic hypertrophy with lower urinary tract symptoms (LUTS)   . Calculus of kidney   . Calculus of ureter   . Renal cyst    Past Surgical History  Procedure Laterality Date  . Abdominal aortic aneurysm repair    . Appendectomy    . Cervical disc arthroplasty  2008  . Kidney stone surgery  2010  . Lithotripsy    . Revision total shoulder arthroplasty Right 01/16/16   Family History  Problem Relation Age of Onset  . Aortic aneurysm Mother   . Gastric cancer Father   . Stroke Father   . Lung disease Sister   . Hypertension Sister   . Sleep apnea Sister    Social History  Substance Use Topics  . Smoking  status: Former Smoker -- 1.00 packs/day for 40 years    Types: Cigarettes    Quit date: 12/08/1996  . Smokeless tobacco: Never Used  . Alcohol Use: 0.0 oz/week    0 Standard drinks or equivalent per week     Comment: 1-3 glasses of wine weekly    Review of Systems 10 Systems reviewed and are negative for acute change except as noted in the HPI.    Allergies  Review of patient's allergies indicates no known allergies.  Home Medications   Prior to Admission medications   Medication Sig Start Date End Date Taking? Authorizing Provider  albuterol (PROVENTIL) (2.5 MG/3ML) 0.083% nebulizer solution Take 2.5 mg by nebulization every 6 (six) hours as needed for wheezing or shortness of breath.    Historical Provider, MD  alfuzosin (UROXATRAL) 10 MG 24 hr tablet Take 10 mg by mouth at bedtime.     Historical Provider, MD  aspirin EC 81 MG tablet Take 81 mg by mouth daily.    Historical Provider, MD  atorvastatin (LIPITOR) 20 MG tablet Take 20 mg by mouth daily.    Historical Provider, MD  b complex vitamins capsule Take 1 capsule by mouth daily.    Historical Provider, MD  Bilberry 1000 MG CAPS Take 1 capsule by mouth daily.  Historical Provider, MD  clindamycin (CLEOCIN) 300 MG capsule Take 300 mg by mouth 3 (three) times daily.    Historical Provider, MD  clopidogrel (PLAVIX) 75 MG tablet Take 75 mg by mouth daily.    Historical Provider, MD  Coenzyme Q-10 200 MG CAPS Take 1 capsule by mouth daily.    Historical Provider, MD  donepezil (ARICEPT) 5 MG tablet Take 5 mg by mouth at bedtime.    Historical Provider, MD  Garlic 1000 MG CAPS Take 1 capsule by mouth daily.    Historical Provider, MD  Vitamins-Lipotropics (LIPO-FLAVONOID PLUS) TABS Take by mouth. 200-100 mg tab    Historical Provider, MD   BP 126/75 mmHg  Pulse 85  Temp(Src) 97.6 F (36.4 C) (Oral)  Resp 18  Ht  (1.727 m)  Wt 150 lb (68.04 kg)  BMI 22.81 kg/m2  SpO2 98% Physical Exam  Constitutional: He appears  well-developed and well-nourished. No distress.  HENT:  Patient has a large dressing on his head with ABD. dressing has fresh blood present. No apparent facial trauma.  Dressings removed and extensive cleaning necessary to remove matted blood. Patient had a laceration to the right parietal scalp measuring 1.5 cm with only slight gaping and an approximately 2 cm hematoma.  Eyes: EOM are normal. Pupils are equal, round, and reactive to light.  Neck:  Patient is in cervical collar.  Cardiovascular: Normal rate, regular rhythm, normal heart sounds and intact distal pulses.   Pulmonary/Chest: Effort normal and breath sounds normal. He exhibits no tenderness.  Abdominal: Soft. He exhibits no distension. There is no tenderness.  Musculoskeletal: Normal range of motion. He exhibits no edema or tenderness.  Neurological: He is alert. No cranial nerve deficit. He exhibits normal muscle tone. Coordination normal.  Skin: Skin is warm and dry.  Psychiatric: He has a normal mood and affect.    ED Course  .Marland KitchenLaceration Repair Date/Time: 04/05/2016 8:29 AM Performed by: Arby Barrette Authorized by: Arby Barrette Body area: head/neck Location details: scalp Laceration length: 1.5 cm Foreign bodies: no foreign bodies Tendon involvement: none Nerve involvement: none Vascular damage: no Anesthesia: local infiltration Local anesthetic: lidocaine 1% with epinephrine Anesthetic total: 3 ml Patient sedated: no Irrigation solution: saline Amount of cleaning: standard Debridement: none Skin closure: Ethilon Number of sutures: 3 Technique: simple Approximation: close Approximation difficulty: simple Comments: There is extensive blood and matting which was cleared and cleaned. Wound cleaned with wound cleanser.    (including critical care time) Labs Review Labs Reviewed  CBC WITH DIFFERENTIAL/PLATELET - Abnormal; Notable for the following:    RBC 3.74 (*)    Hemoglobin 11.1 (*)    HCT 33.9 (*)     RDW 16.4 (*)    Platelets 414 (*)    All other components within normal limits  BASIC METABOLIC PANEL - Abnormal; Notable for the following:    Glucose, Bld 101 (*)    All other components within normal limits    Imaging Review Ct Head Wo Contrast  04/05/2016  CLINICAL DATA:  80 year old male post fall with laceration right posterior head. Denies loss of consciousness. Initial encounter. EXAM: CT HEAD WITHOUT CONTRAST CT CERVICAL SPINE WITHOUT CONTRAST TECHNIQUE: Multidetector CT imaging of the head and cervical spine was performed following the standard protocol without intravenous contrast. Multiplanar CT image reconstructions of the cervical spine were also generated. COMPARISON:  No comparison exams available for direct review. Report from 08/22/2002. FINDINGS: CT HEAD FINDINGS No skull fracture or intracranial hemorrhage. Mild chronic  microvascular changes without CT evidence of large acute infarct. Mild global atrophy without hydrocephalus. No intracranial mass lesion noted on this unenhanced exam. No acute orbital abnormality. Visualize mastoid air cells, mid middle ear cavities and paranasal sinuses are clear. Vascular calcifications. CT CERVICAL SPINE FINDINGS Post decompression and fusion from a posterior approach C3 through C7. There may be loosening of the left C7 screw. Left C3 screw extends partially into the transverse foramen. No acute cervical spine fracture or abnormal prevertebral soft tissue swelling. Carotid bifurcation calcifications. Visualized lung apices are clear. IMPRESSION: CT HEAD No skull fracture or intracranial hemorrhage. Mild chronic microvascular changes without CT evidence of large acute infarct. Mild global atrophy without hydrocephalus. CT CERVICAL SPINE Post decompression and fusion from a posterior approach C3 through C7. There may be loosening of the left C7 screw. No acute cervical spine fracture or abnormal prevertebral soft tissue swelling. Electronically  Signed   By: Steven  Olson MLacy Duverney.D.   On: 04/05/2016 09:12   Ct Cervical Spine Wo Contrast  04/05/2016  CLINICAL DATA:  80 year old male post fall with laceration right posterior head. Denies loss of consciousness. Initial encounter. EXAM: CT HEAD WITHOUT CONTRAST CT CERVICAL SPINE WITHOUT CONTRAST TECHNIQUE: Multidetector CT imaging of the head and cervical spine was performed following the standard protocol without intravenous contrast. Multiplanar CT image reconstructions of the cervical spine were also generated. COMPARISON:  No comparison exams available for direct review. Report from 08/22/2002. FINDINGS: CT HEAD FINDINGS No skull fracture or intracranial hemorrhage. Mild chronic microvascular changes without CT evidence of large acute infarct. Mild global atrophy without hydrocephalus. No intracranial mass lesion noted on this unenhanced exam. No acute orbital abnormality. Visualize mastoid air cells, mid middle ear cavities and paranasal sinuses are clear. Vascular calcifications. CT CERVICAL SPINE FINDINGS Post decompression and fusion from a posterior approach C3 through C7. There may be loosening of the left C7 screw. Left C3 screw extends partially into the transverse foramen. No acute cervical spine fracture or abnormal prevertebral soft tissue swelling. Carotid bifurcation calcifications. Visualized lung apices are clear. IMPRESSION: CT HEAD No skull fracture or intracranial hemorrhage. Mild chronic microvascular changes without CT evidence of large acute infarct. Mild global atrophy without hydrocephalus. CT CERVICAL SPINE Post decompression and fusion from a posterior approach C3 through C7. There may be loosening of the left C7 screw. No acute cervical spine fracture or abnormal prevertebral soft tissue swelling. Electronically Signed   By: Lacy DuverneySteven  Olson M.D.   On: 04/05/2016 09:12   I have personally reviewed and evaluated these images and lab results as part of my medical decision-making.   EKG  Interpretation None      MDM   Final diagnoses:  Head injury, initial encounter  Fall, initial encounter  Scalp laceration, initial encounter   Patient arrived mechanical fall. There did appear to be extensive bleeding with a bloodsoaked ABD dressing and Kerlix wrap. Once these were removed and blood was cleaned and hair trimmed away from the wound, wound was approximately 1.5 cm and no longer actively bleeding. This was cleaned and sutured. CT head and CT cervical spine did not identify any acute injury. Patient was reassessed again after CT, he denies any headache or cervical pain. He is alert and conversant with me. The patient reports he is ready to return to nursing home and does not need his family to come to the emergency department. We will attempt however to confirm that they were notified of the patient's visit. Patient is discharged with  head injury instructions and suture care instructions.    Arby Barrette, MD 04/05/16 [phone]

## 2016-04-07 ENCOUNTER — Non-Acute Institutional Stay (SKILLED_NURSING_FACILITY): Payer: Medicare Other | Admitting: Internal Medicine

## 2016-04-07 ENCOUNTER — Encounter: Payer: Self-pay | Admitting: Internal Medicine

## 2016-04-07 DIAGNOSIS — S0190XD Unspecified open wound of unspecified part of head, subsequent encounter: Secondary | ICD-10-CM | POA: Diagnosis not present

## 2016-04-07 DIAGNOSIS — S2231XS Fracture of one rib, right side, sequela: Secondary | ICD-10-CM

## 2016-04-07 DIAGNOSIS — R269 Unspecified abnormalities of gait and mobility: Secondary | ICD-10-CM

## 2016-04-07 NOTE — Progress Notes (Signed)
Location:   [facility] Room Number: 100/P Place of Service:  SNF [phone]) Provider:  Consuella Lose, MD  Patient Care Team: Teodoro Spray, MD as PCP - General (Internal Medicine)  Extended Emergency Contact Information Primary Emergency Contact: [facility] Address: [address]          [place],  [address] Home Phone: [phone] Relation: None  Code Status:  DNR Goals of care: Advanced Directive information Advanced Directives 04/07/2016  Does patient have an advance directive? Yes  Type of Advance Directive Out of facility DNR (pink MOST or yellow form)  Does patient want to make changes to advanced directive? No - Patient declined  Copy of advanced directive(s) in chart? Yes  Pre-existing out of facility DNR order (yellow form or pink MOST form) -     Chief Complaint  Patient presents with  . Acute Visit    Follow-up, For head wound    HPI:  Pt is a 80 y.o. male seen today for an acute visit for Follow-up of a head wound.  Patient had a fall the facility with a head laceration-she was sent to the ER where there was a negative CT scan of the head and cervical spine.  There was initially some bleeding but apparently this was stopped fairly unremarkably.  Currently he is in his wheelchair mental status appears to be stable he does have a history significant dementia but is pleasant and interactive today nursing staff has not noted any neurologic changes he appears to be doing well in this regards his vital signs remained stable.  He is not complaining of any headache or dizziness  He did receive sutures-   Past Medical History  Diagnosis Date  . Aortic aneurysm (HCC)     2012  . Hyperlipidemia   . Renal calculi   . Bacteremia due to Staphylococcus aureus   . Sepsis due to Methicillin susceptible Staphylococcus aureus (HCC)   . COPD exacerbation (HCC)   . Pyelonephritis   . Septic olecranon bursitis of right elbow   . Liver cyst    . Olecranon bursitis of right elbow   . H/O endovascular stent graft for abdominal aortic aneurysm   . AAA (abdominal aortic aneurysm) (HCC)   . Heart murmur   . Depression   . History of fusion of cervical spine   . Urinary frequency   . Elevated prostate specific antigen (PSA)   . Benign prostatic hypertrophy with lower urinary tract symptoms (LUTS)   . Calculus of kidney   . Calculus of ureter   . Renal cyst    Past Surgical History  Procedure Laterality Date  . Abdominal aortic aneurysm repair    . Appendectomy    . Cervical disc arthroplasty  2008  . Kidney stone surgery  2010  . Lithotripsy    . Revision total shoulder arthroplasty Right 01/16/16    No Known Allergies    Medication List       This list is accurate as of: 04/07/16  3:15 PM.  Always use your most recent med list.               albuterol 108 (90 Base) MCG/ACT inhaler  Commonly known as:  PROVENTIL HFA;VENTOLIN HFA  Inhale 2 puffs into the lungs every 4 (four) hours as needed. For COPD     alfuzosin 10 MG 24 hr tablet  Commonly known as:  UROXATRAL  Take 10 mg by mouth at bedtime.  aspirin EC 81 MG tablet  Take 81 mg by mouth daily.     atorvastatin 20 MG tablet  Commonly known as:  LIPITOR  Take 20 mg by mouth daily.     b complex vitamins capsule  Take 1 capsule by mouth daily.     Bilberry 1000 MG Caps  Take 1 capsule by mouth daily.     budesonide-formoterol 80-4.5 MCG/ACT inhaler  Commonly known as:  SYMBICORT  Inhale 2 puffs into the lungs 2 (two) times daily. For COPD     CeFAZolin Sodium 2 GM/20ML Sosy  Infuse 20 mls ( 2 g total ) into vein every 8 hours     clopidogrel 75 MG tablet  Commonly known as:  PLAVIX  Take 75 mg by mouth daily.     Coenzyme Q-10 200 MG Caps  Take 1 capsule by mouth daily.     donepezil 5 MG tablet  Commonly known as:  ARICEPT  Take 5 mg by mouth at bedtime.     finasteride 5 MG tablet  Commonly known as:  PROSCAR  Take 5 mg by mouth  daily.     Garlic 1000 MG Caps  Take 1 capsule by mouth daily.     LIPO-FLAVONOID PLUS Tabs  Take by mouth. 200-100 mg tab daily     PREVAGEN PO  Take 1 tablet by mouth daily for memory     rifampin 300 MG capsule  Commonly known as:  RIFADIN  Take 300 mg by mouth 2 (two) times daily.     VITAMIN B 12 PO  Take 1 capsule by mouth once  daily for supplement        Review of Systems  DATA OBTAINED: from patient, nurse GENERAL:  no fevers, fatigue, appetite changes SKIN: No itching, rash or wounds sutures sight  back of right scalp appears unremarkable EYES: No eye pain, redness, discharge EARS: No earache, tinnitus, change in hearing NOSE: No congestion, drainage or bleeding  MOUTH/THROAT: No mouth or tooth pain, No sore throat RESPIRATORY: No cough, wheezing, SOB CARDIAC: No chest pain, palpitations, lower extremity edema  GI: No abdominal pain, No N/V/D or constipation, No heartburn or reflux  GU: No dysuria, frequency or urgency, or incontinence  MUSCULOSKELETAL: No unrelieved bone/joint pain NEUROLOGIC: No headache, dizziness or focal weakness PSYCHIATRIC: No c/o anxiety or sadness   Immunization History  Administered Date(s) Administered  . Tdap 06/28/2014   Pertinent  Health Maintenance Due  Topic Date Due  . PNA vac Low Risk Adult (1 of 2 - PCV13) 05/02/2003  . INFLUENZA VACCINE  04/19/2016   No flowsheet data found. Functional Status Survey:    Filed Vitals:   04/07/16 1451  BP: 116/74  Pulse: 92  Temp: 97.6 F (36.4 C)  TempSrc: Oral  Resp: 18  Height: 5\' 8"  (1.727 m)  Weight: 150 lb (68.04 kg)   Body mass index is 22.81 kg/(m^2). Physical Exam   GENERAL APPEARANCE: Alert, conversant,  No acute distress.  SKIN: No diaphoresis rash-Nature site right back scalp appears unremarkable there is no sign of infection no surrounding edema erythema area does not really appear to be significantly tender there is no drainage HEAD: Normocephalic, atraumatic   EYES: Conjunctiva/lids clear. Pupils round, reactive. EOMs intact.  EARS: External exam WNL, canals clear. Hearing grossly normal.  NOSE: No deformity or discharge.  MOUTH/THROAT: Lips w/o lesions pharynx clear mucous membranes moist  RESPIRATORY: Breathing is even, unlabored. Lung sounds are clear  CARDIOVASCULAR: Heart RRR  no murmurs, rubs or gallops. Trace peripheral edema.   GASTROINTESTINAL: Abdomen is soft, non-tender, not distended w/ normal bowel sounds.   MUSCULOSKELETAL: No abnormal joints or musculature-ambulatory and wheelchair NEUROLOGIC:  Cranial nerves 2-12 grossly intact. Moves all extremities  PSYCHIATRIC: Mood and affect appropriate to situation with dementia, no behavioral  Labs reviewed:  Recent Labs  03/06/16 04/05/16 0834  NA 136* 136  K 3.8 4.4  CL  --  104  CO2  --  27  GLUCOSE  --  101*  BUN 13 15  CREATININE 0.6 0.81  CALCIUM  --  9.2    Recent Labs  03/06/16  AST 51*  ALT 39  ALKPHOS 98    Recent Labs  03/06/16 04/05/16 0834  WBC 16.5 7.8  NEUTROABS  --  5.8  HGB 10.4* 11.1*  HCT 32* 33.9*  MCV  --  90.6  PLT 377 414*   No results found for: TSH No results found for: HGBA1C No results found for: CHOL, HDL, LDLCALC, LDLDIRECT, TRIG, CHOLHDL  Significant Diagnostic Results in last 30 days:  Ct Head Wo Contrast  04/05/2016  ADDENDUM REPORT: 04/05/2016 10:11 ADDENDUM: Mild spinal stenosis C2-3. Foraminal narrowing C3-4 thru C7-T1. Streak artifact from metallic hardware slightly limits evaluation. Electronically Signed   By: Lacy Duverney M.D.   On: 04/05/2016 10:11  04/05/2016  CLINICAL DATA:  80 year old male post fall with laceration right posterior head. Denies loss of consciousness. Initial encounter. EXAM: CT HEAD WITHOUT CONTRAST CT CERVICAL SPINE WITHOUT CONTRAST TECHNIQUE: Multidetector CT imaging of the head and cervical spine was performed following the standard protocol without intravenous contrast. Multiplanar CT image  reconstructions of the cervical spine were also generated. COMPARISON:  No comparison exams available for direct review. Report from 08/22/2002. FINDINGS: CT HEAD FINDINGS No skull fracture or intracranial hemorrhage. Mild chronic microvascular changes without CT evidence of large acute infarct. Mild global atrophy without hydrocephalus. No intracranial mass lesion noted on this unenhanced exam. No acute orbital abnormality. Visualize mastoid air cells, mid middle ear cavities and paranasal sinuses are clear. Vascular calcifications. CT CERVICAL SPINE FINDINGS Post decompression and fusion from a posterior approach C3 through C7. There may be loosening of the left C7 screw. Left C3 screw extends partially into the transverse foramen. No acute cervical spine fracture or abnormal prevertebral soft tissue swelling. Carotid bifurcation calcifications. Visualized lung apices are clear. IMPRESSION: CT HEAD No skull fracture or intracranial hemorrhage. Mild chronic microvascular changes without CT evidence of large acute infarct. Mild global atrophy without hydrocephalus. CT CERVICAL SPINE Post decompression and fusion from a posterior approach C3 through C7. There may be loosening of the left C7 screw. No acute cervical spine fracture or abnormal prevertebral soft tissue swelling. Electronically Signed: By: Lacy Duverney M.D. On: 04/05/2016 09:12   Ct Cervical Spine Wo Contrast  04/05/2016  ADDENDUM REPORT: 04/05/2016 10:11 ADDENDUM: Mild spinal stenosis C2-3. Foraminal narrowing C3-4 thru C7-T1. Streak artifact from metallic hardware slightly limits evaluation. Electronically Signed   By: Lacy Duverney M.D.   On: 04/05/2016 10:11  04/05/2016  CLINICAL DATA:  80 year old male post fall with laceration right posterior head. Denies loss of consciousness. Initial encounter. EXAM: CT HEAD WITHOUT CONTRAST CT CERVICAL SPINE WITHOUT CONTRAST TECHNIQUE: Multidetector CT imaging of the head and cervical spine was performed  following the standard protocol without intravenous contrast. Multiplanar CT image reconstructions of the cervical spine were also generated. COMPARISON:  No comparison exams available for direct review. Report from 08/22/2002. FINDINGS:  CT HEAD FINDINGS No skull fracture or intracranial hemorrhage. Mild chronic microvascular changes without CT evidence of large acute infarct. Mild global atrophy without hydrocephalus. No intracranial mass lesion noted on this unenhanced exam. No acute orbital abnormality. Visualize mastoid air cells, mid middle ear cavities and paranasal sinuses are clear. Vascular calcifications. CT CERVICAL SPINE FINDINGS Post decompression and fusion from a posterior approach C3 through C7. There may be loosening of the left C7 screw. Left C3 screw extends partially into the transverse foramen. No acute cervical spine fracture or abnormal prevertebral soft tissue swelling. Carotid bifurcation calcifications. Visualized lung apices are clear. IMPRESSION: CT HEAD No skull fracture or intracranial hemorrhage. Mild chronic microvascular changes without CT evidence of large acute infarct. Mild global atrophy without hydrocephalus. CT CERVICAL SPINE Post decompression and fusion from a posterior approach C3 through C7. There may be loosening of the left C7 screw. No acute cervical spine fracture or abnormal prevertebral soft tissue swelling. Electronically Signed: By: Lacy DuverneySteven  Olson M.D. On: 04/05/2016 09:12    Assessment/Plan  #1 fall with head injury-patient appears to be at his baseline again workup in the ER as noted above was negative vital signs are stable he is not complaining of any headache or dizziness continues to have latent wheelchair at his baseline I do not see any neurologic changes-suture site appears unremarkable at some point these will have to be removed.  He appears to be doing well.  I also no previous history of rib fracture this again appears to be stable there's been  no history here of increased shortness of breath chest pain complaints-this appears to be healing unremarkably as well.  [id number]-of note greater than 25 minutes spent assessing patient-reviewing his labs-his chart-including ER report-discussing his status with nursing staff-and coordinating plan of care     London SheerLuster, Sally C, [place]MA [phone]

## 2016-04-22 DIAGNOSIS — Z959 Presence of cardiac and vascular implant and graft, unspecified: Secondary | ICD-10-CM | POA: Diagnosis not present

## 2016-04-22 DIAGNOSIS — Z09 Encounter for follow-up examination after completed treatment for conditions other than malignant neoplasm: Secondary | ICD-10-CM | POA: Diagnosis not present

## 2016-04-22 DIAGNOSIS — Z792 Long term (current) use of antibiotics: Secondary | ICD-10-CM | POA: Diagnosis not present

## 2016-04-22 DIAGNOSIS — R7881 Bacteremia: Secondary | ICD-10-CM | POA: Diagnosis not present

## 2016-04-22 DIAGNOSIS — B9562 Methicillin resistant Staphylococcus aureus infection as the cause of diseases classified elsewhere: Secondary | ICD-10-CM | POA: Diagnosis not present

## 2016-04-22 DIAGNOSIS — D649 Anemia, unspecified: Secondary | ICD-10-CM | POA: Diagnosis not present

## 2016-04-22 DIAGNOSIS — I776 Arteritis, unspecified: Secondary | ICD-10-CM | POA: Diagnosis not present

## 2016-04-22 DIAGNOSIS — Z960 Presence of urogenital implants: Secondary | ICD-10-CM | POA: Diagnosis not present

## 2016-04-25 ENCOUNTER — Non-Acute Institutional Stay (SKILLED_NURSING_FACILITY): Payer: Medicare Other | Admitting: Internal Medicine

## 2016-04-25 ENCOUNTER — Encounter: Payer: Self-pay | Admitting: Internal Medicine

## 2016-04-25 DIAGNOSIS — N138 Other obstructive and reflux uropathy: Secondary | ICD-10-CM

## 2016-04-25 DIAGNOSIS — A4101 Sepsis due to Methicillin susceptible Staphylococcus aureus: Secondary | ICD-10-CM

## 2016-04-25 DIAGNOSIS — N401 Enlarged prostate with lower urinary tract symptoms: Secondary | ICD-10-CM

## 2016-04-25 DIAGNOSIS — E785 Hyperlipidemia, unspecified: Secondary | ICD-10-CM | POA: Diagnosis not present

## 2016-04-25 DIAGNOSIS — J984 Other disorders of lung: Secondary | ICD-10-CM | POA: Diagnosis not present

## 2016-04-25 DIAGNOSIS — F0390 Unspecified dementia without behavioral disturbance: Secondary | ICD-10-CM

## 2016-04-25 DIAGNOSIS — I2510 Atherosclerotic heart disease of native coronary artery without angina pectoris: Secondary | ICD-10-CM | POA: Diagnosis not present

## 2016-04-25 DIAGNOSIS — A4901 Methicillin susceptible Staphylococcus aureus infection, unspecified site: Secondary | ICD-10-CM | POA: Diagnosis not present

## 2016-04-25 NOTE — Progress Notes (Signed)
MRN: [id number] Name: Michael Dillon  Sex: male Age: 80 y.o. DOB: 10-Feb-1936  PSC #:  Facility/Room:[facility] / [facility] P Level Of Care: SNF Provider: Randon Goldsmith. Lyn Hollingshead, MD Emergency Contacts: Extended Emergency Contact Information Primary Emergency Contact: [facility] Address: [address]          [place],  [address] Home Phone: [phone] Relation: None  Code Status: DNR  Allergies: Review of patient's allergies indicates no known allergies.  Chief Complaint  Patient presents with  . Discharge Note    Discharged from SNF    HPI: Patient is 80 y.o. male with  HLD, COPD,AAA with graft in past who was admitted to [facility] form 6/18-28 where he was trx'ed for MSSA bacteremia 2/2 AAA graft infection, tx with oxacillen, gentamycin and rifampin. Pt will be needing LIFELONG prophylaxis. Pt is admitted to SNF for gneralized weakness and is now ready to be d/c to home.  Past Medical History:  Diagnosis Date  . AAA (abdominal aortic aneurysm) (HCC)   . Aortic aneurysm (HCC)    2012  . Bacteremia due to Staphylococcus aureus   . Benign prostatic hypertrophy with lower urinary tract symptoms (LUTS)   . Calculus of kidney   . Calculus of ureter   . COPD exacerbation (HCC)   . Depression   . Elevated prostate specific antigen (PSA)   . H/O endovascular stent graft for abdominal aortic aneurysm   . Heart murmur   . History of fusion of cervical spine   . Hyperlipidemia   . Liver cyst   . Olecranon bursitis of right elbow   . Pyelonephritis   . Renal calculi   . Renal cyst   . Sepsis due to Methicillin susceptible Staphylococcus aureus (HCC)   . Septic olecranon bursitis of right elbow   . Urinary frequency     Past Surgical History:  Procedure Laterality Date  . ABDOMINAL AORTIC ANEURYSM REPAIR    . APPENDECTOMY    . CERVICAL DISC ARTHROPLASTY  2008  . KIDNEY STONE SURGERY  2010  . LITHOTRIPSY    . REVISION TOTAL SHOULDER ARTHROPLASTY Right 01/16/16      Medication  List       Accurate as of 04/25/16  2:10 PM. Always use your most recent med list.          albuterol 108 (90 Base) MCG/ACT inhaler Commonly known as:  PROVENTIL HFA;VENTOLIN HFA Inhale 2 puffs into the lungs every 4 (four) hours as needed. For COPD   alfuzosin 10 MG 24 hr tablet Commonly known as:  UROXATRAL Take 10 mg by mouth at bedtime.   aspirin EC 81 MG tablet Take 81 mg by mouth daily.   atorvastatin 20 MG tablet Commonly known as:  LIPITOR Take 20 mg by mouth daily.   b complex vitamins capsule Take 1 capsule by mouth daily.   budesonide-formoterol 80-4.5 MCG/ACT inhaler Commonly known as:  SYMBICORT Inhale 2 puffs into the lungs 2 (two) times daily. For COPD   clopidogrel 75 MG tablet Commonly known as:  PLAVIX Take 75 mg by mouth daily.   Coenzyme Q-10 200 MG Caps Take 1 capsule by mouth daily.   donepezil 5 MG tablet Commonly known as:  ARICEPT Take 5 mg by mouth at bedtime.   ENSURE Take 1 Can by mouth 2 (two) times daily between meals.   finasteride 5 MG tablet Commonly known as:  PROSCAR Take 5 mg by mouth daily.   Garlic 1000 MG Caps Take 1 capsule by  mouth daily.   LIPO-FLAVONOID PLUS Tabs Take by mouth. 200-100 mg tab daily   minocycline 100 MG tablet Commonly known as:  DYNACIN Take 100 mg by mouth 2 (two) times daily.   potassium chloride 10 MEQ CR capsule Commonly known as:  MICRO-K Take 10 mEq by mouth daily.   rifampin 300 MG capsule Commonly known as:  RIFADIN Take 300 mg by mouth 2 (two) times daily.       Meds ordered this encounter  Medications  . minocycline (DYNACIN) 100 MG tablet    Sig: Take 100 mg by mouth 2 (two) times daily.  . potassium chloride (MICRO-K) 10 MEQ CR capsule    Sig: Take 10 mEq by mouth daily.  Marland Kitchen ENSURE (ENSURE)    Sig: Take 1 Can by mouth 2 (two) times daily between meals.    Immunization History  Administered Date(s) Administered  . PPD Test 03/16/2016, 04/01/2016  . Tdap 06/28/2014     Social History  Substance Use Topics  . Smoking status: Former Smoker    Packs/day: 1.00    Years: 40.00    Types: Cigarettes    Quit date: 12/08/1996  . Smokeless tobacco: Never Used  . Alcohol use 0.0 oz/week     Comment: 1-3 glasses of wine weekly    Vitals:   04/25/16 1352  BP: 139/71  Pulse: 66  Resp: 20  Temp: 97.1 F (36.2 C)    Physical Exam  GENERAL APPEARANCE: Alert, conversant. No acute distress.  HEENT: Unremarkable. RESPIRATORY: Breathing is even, unlabored. Lung sounds are clear   CARDIOVASCULAR: Heart RRR no murmurs, rubs or gallops. No peripheral edema.  GASTROINTESTINAL: Abdomen is soft, non-tender, not distended w/ normal bowel sounds.  NEUROLOGIC: Cranial nerves 2-12 grossly intact. Moves all extremities  Patient Active Problem List   Diagnosis Date Noted  . Right rib fracture 03/27/2016  . MSSA (methicillin susceptible Staphylococcus aureus) septicemia (HCC) 03/26/2016  . MSSA (methicillin susceptible Staphylococcus aureus) infection 03/26/2016  . Dementia without behavioral disturbance 03/26/2016  . Restrictive lung disease 03/26/2016  . Frequent falls 01/15/2016  . Abnormality of gait 01/15/2016  . Septic olecranon bursitis of right elbow 01/11/2016  . CAD in native artery 10/15/2014  . Calculus of kidney 10/15/2014  . Benign prostatic hyperplasia with urinary obstruction 12/09/2011  . Hyperlipidemia 10/29/2009  . LACERATION, HAND, RIGHT 10/29/2009    CBC    Component Value Date/Time   WBC 7.8 04/05/2016 0834   RBC 3.74 (L) 04/05/2016 0834   HGB 11.1 (L) 04/05/2016 0834   HCT 33.9 (L) 04/05/2016 0834   PLT 414 (H) 04/05/2016 0834   MCV 90.6 04/05/2016 0834   LYMPHSABS 1.3 04/05/2016 0834   MONOABS 0.6 04/05/2016 0834   EOSABS 0.1 04/05/2016 0834   BASOSABS 0.0 04/05/2016 0834    CMP     Component Value Date/Time   NA 136 04/05/2016 0834   NA 136 (A) 03/30/2016   K 4.4 04/05/2016 0834   CL 104 04/05/2016 0834   CO2 27  04/05/2016 0834   GLUCOSE 101 (H) 04/05/2016 0834   BUN 15 04/05/2016 0834   BUN 13 03/30/2016   CREATININE 0.81 04/05/2016 0834   CALCIUM 9.2 04/05/2016 0834   AST 51 (A) 03/06/2016   ALT 39 03/06/2016   ALKPHOS 98 03/06/2016   GFRNONAA >60 04/05/2016 0834   GFRAA >60 04/05/2016 0834    Assessment and Plan  Pt is d/c to home with HH/OT/PT/ Nursing. Medications have ben reconciled and Rx's written.  Timespent > 30 min;> 50% of time with patient was spent reviewing records, labs, tests and studies, counseling and developing plan of care  Randon Goldsmithnne D. Lyn HollingsheadAlexander, MD

## 2016-04-29 ENCOUNTER — Encounter: Payer: Self-pay | Admitting: Internal Medicine

## 2016-04-29 DIAGNOSIS — H43811 Vitreous degeneration, right eye: Secondary | ICD-10-CM | POA: Diagnosis not present

## 2016-04-29 DIAGNOSIS — H2513 Age-related nuclear cataract, bilateral: Secondary | ICD-10-CM | POA: Diagnosis not present

## 2016-05-03 DIAGNOSIS — F0390 Unspecified dementia without behavioral disturbance: Secondary | ICD-10-CM | POA: Diagnosis not present

## 2016-05-03 DIAGNOSIS — A4901 Methicillin susceptible Staphylococcus aureus infection, unspecified site: Secondary | ICD-10-CM | POA: Diagnosis not present

## 2016-05-03 DIAGNOSIS — R531 Weakness: Secondary | ICD-10-CM | POA: Diagnosis not present

## 2016-05-03 DIAGNOSIS — J441 Chronic obstructive pulmonary disease with (acute) exacerbation: Secondary | ICD-10-CM | POA: Diagnosis not present

## 2016-05-03 DIAGNOSIS — F329 Major depressive disorder, single episode, unspecified: Secondary | ICD-10-CM | POA: Diagnosis not present

## 2016-05-03 DIAGNOSIS — N401 Enlarged prostate with lower urinary tract symptoms: Secondary | ICD-10-CM | POA: Diagnosis not present

## 2016-05-03 DIAGNOSIS — R2689 Other abnormalities of gait and mobility: Secondary | ICD-10-CM | POA: Diagnosis not present

## 2016-05-03 DIAGNOSIS — Z87440 Personal history of urinary (tract) infections: Secondary | ICD-10-CM | POA: Diagnosis not present

## 2016-05-03 DIAGNOSIS — Z7902 Long term (current) use of antithrombotics/antiplatelets: Secondary | ICD-10-CM | POA: Diagnosis not present

## 2016-05-03 DIAGNOSIS — I2510 Atherosclerotic heart disease of native coronary artery without angina pectoris: Secondary | ICD-10-CM | POA: Diagnosis not present

## 2016-05-03 DIAGNOSIS — Z7982 Long term (current) use of aspirin: Secondary | ICD-10-CM | POA: Diagnosis not present

## 2016-05-03 DIAGNOSIS — T827XXD Infection and inflammatory reaction due to other cardiac and vascular devices, implants and grafts, subsequent encounter: Secondary | ICD-10-CM | POA: Diagnosis not present

## 2016-05-03 DIAGNOSIS — Z9181 History of falling: Secondary | ICD-10-CM | POA: Diagnosis not present

## 2016-05-05 DIAGNOSIS — F329 Major depressive disorder, single episode, unspecified: Secondary | ICD-10-CM | POA: Diagnosis not present

## 2016-05-05 DIAGNOSIS — R2689 Other abnormalities of gait and mobility: Secondary | ICD-10-CM | POA: Diagnosis not present

## 2016-05-05 DIAGNOSIS — A4901 Methicillin susceptible Staphylococcus aureus infection, unspecified site: Secondary | ICD-10-CM | POA: Diagnosis not present

## 2016-05-05 DIAGNOSIS — J441 Chronic obstructive pulmonary disease with (acute) exacerbation: Secondary | ICD-10-CM | POA: Diagnosis not present

## 2016-05-05 DIAGNOSIS — R531 Weakness: Secondary | ICD-10-CM | POA: Diagnosis not present

## 2016-05-05 DIAGNOSIS — T827XXD Infection and inflammatory reaction due to other cardiac and vascular devices, implants and grafts, subsequent encounter: Secondary | ICD-10-CM | POA: Diagnosis not present

## 2016-05-10 DIAGNOSIS — I2510 Atherosclerotic heart disease of native coronary artery without angina pectoris: Secondary | ICD-10-CM | POA: Diagnosis not present

## 2016-05-10 DIAGNOSIS — R972 Elevated prostate specific antigen [PSA]: Secondary | ICD-10-CM | POA: Diagnosis not present

## 2016-05-11 DIAGNOSIS — R2689 Other abnormalities of gait and mobility: Secondary | ICD-10-CM | POA: Diagnosis not present

## 2016-05-11 DIAGNOSIS — A4901 Methicillin susceptible Staphylococcus aureus infection, unspecified site: Secondary | ICD-10-CM | POA: Diagnosis not present

## 2016-05-11 DIAGNOSIS — F329 Major depressive disorder, single episode, unspecified: Secondary | ICD-10-CM | POA: Diagnosis not present

## 2016-05-11 DIAGNOSIS — T827XXD Infection and inflammatory reaction due to other cardiac and vascular devices, implants and grafts, subsequent encounter: Secondary | ICD-10-CM | POA: Diagnosis not present

## 2016-05-11 DIAGNOSIS — J441 Chronic obstructive pulmonary disease with (acute) exacerbation: Secondary | ICD-10-CM | POA: Diagnosis not present

## 2016-05-11 DIAGNOSIS — R531 Weakness: Secondary | ICD-10-CM | POA: Diagnosis not present

## 2016-05-13 DIAGNOSIS — R2689 Other abnormalities of gait and mobility: Secondary | ICD-10-CM | POA: Diagnosis not present

## 2016-05-13 DIAGNOSIS — F329 Major depressive disorder, single episode, unspecified: Secondary | ICD-10-CM | POA: Diagnosis not present

## 2016-05-13 DIAGNOSIS — T827XXD Infection and inflammatory reaction due to other cardiac and vascular devices, implants and grafts, subsequent encounter: Secondary | ICD-10-CM | POA: Diagnosis not present

## 2016-05-13 DIAGNOSIS — R531 Weakness: Secondary | ICD-10-CM | POA: Diagnosis not present

## 2016-05-13 DIAGNOSIS — J441 Chronic obstructive pulmonary disease with (acute) exacerbation: Secondary | ICD-10-CM | POA: Diagnosis not present

## 2016-05-13 DIAGNOSIS — A4901 Methicillin susceptible Staphylococcus aureus infection, unspecified site: Secondary | ICD-10-CM | POA: Diagnosis not present

## 2016-05-17 DIAGNOSIS — F329 Major depressive disorder, single episode, unspecified: Secondary | ICD-10-CM | POA: Diagnosis not present

## 2016-05-17 DIAGNOSIS — J441 Chronic obstructive pulmonary disease with (acute) exacerbation: Secondary | ICD-10-CM | POA: Diagnosis not present

## 2016-05-17 DIAGNOSIS — A4901 Methicillin susceptible Staphylococcus aureus infection, unspecified site: Secondary | ICD-10-CM | POA: Diagnosis not present

## 2016-05-17 DIAGNOSIS — T827XXD Infection and inflammatory reaction due to other cardiac and vascular devices, implants and grafts, subsequent encounter: Secondary | ICD-10-CM | POA: Diagnosis not present

## 2016-05-17 DIAGNOSIS — R2689 Other abnormalities of gait and mobility: Secondary | ICD-10-CM | POA: Diagnosis not present

## 2016-05-17 DIAGNOSIS — R531 Weakness: Secondary | ICD-10-CM | POA: Diagnosis not present

## 2016-05-18 DIAGNOSIS — R531 Weakness: Secondary | ICD-10-CM | POA: Diagnosis not present

## 2016-05-18 DIAGNOSIS — F329 Major depressive disorder, single episode, unspecified: Secondary | ICD-10-CM | POA: Diagnosis not present

## 2016-05-18 DIAGNOSIS — R2689 Other abnormalities of gait and mobility: Secondary | ICD-10-CM | POA: Diagnosis not present

## 2016-05-18 DIAGNOSIS — T827XXD Infection and inflammatory reaction due to other cardiac and vascular devices, implants and grafts, subsequent encounter: Secondary | ICD-10-CM | POA: Diagnosis not present

## 2016-05-18 DIAGNOSIS — A4901 Methicillin susceptible Staphylococcus aureus infection, unspecified site: Secondary | ICD-10-CM | POA: Diagnosis not present

## 2016-05-18 DIAGNOSIS — J441 Chronic obstructive pulmonary disease with (acute) exacerbation: Secondary | ICD-10-CM | POA: Diagnosis not present

## 2016-05-19 DIAGNOSIS — T827XXD Infection and inflammatory reaction due to other cardiac and vascular devices, implants and grafts, subsequent encounter: Secondary | ICD-10-CM | POA: Diagnosis not present

## 2016-05-19 DIAGNOSIS — J441 Chronic obstructive pulmonary disease with (acute) exacerbation: Secondary | ICD-10-CM | POA: Diagnosis not present

## 2016-05-19 DIAGNOSIS — R2689 Other abnormalities of gait and mobility: Secondary | ICD-10-CM | POA: Diagnosis not present

## 2016-05-19 DIAGNOSIS — F329 Major depressive disorder, single episode, unspecified: Secondary | ICD-10-CM | POA: Diagnosis not present

## 2016-05-19 DIAGNOSIS — R531 Weakness: Secondary | ICD-10-CM | POA: Diagnosis not present

## 2016-05-19 DIAGNOSIS — A4901 Methicillin susceptible Staphylococcus aureus infection, unspecified site: Secondary | ICD-10-CM | POA: Diagnosis not present

## 2016-05-24 DIAGNOSIS — R531 Weakness: Secondary | ICD-10-CM | POA: Diagnosis not present

## 2016-05-24 DIAGNOSIS — A4901 Methicillin susceptible Staphylococcus aureus infection, unspecified site: Secondary | ICD-10-CM | POA: Diagnosis not present

## 2016-05-24 DIAGNOSIS — R2689 Other abnormalities of gait and mobility: Secondary | ICD-10-CM | POA: Diagnosis not present

## 2016-05-24 DIAGNOSIS — J441 Chronic obstructive pulmonary disease with (acute) exacerbation: Secondary | ICD-10-CM | POA: Diagnosis not present

## 2016-05-24 DIAGNOSIS — T827XXD Infection and inflammatory reaction due to other cardiac and vascular devices, implants and grafts, subsequent encounter: Secondary | ICD-10-CM | POA: Diagnosis not present

## 2016-05-24 DIAGNOSIS — F329 Major depressive disorder, single episode, unspecified: Secondary | ICD-10-CM | POA: Diagnosis not present

## 2016-05-26 DIAGNOSIS — T827XXD Infection and inflammatory reaction due to other cardiac and vascular devices, implants and grafts, subsequent encounter: Secondary | ICD-10-CM | POA: Diagnosis not present

## 2016-05-26 DIAGNOSIS — J441 Chronic obstructive pulmonary disease with (acute) exacerbation: Secondary | ICD-10-CM | POA: Diagnosis not present

## 2016-05-26 DIAGNOSIS — F329 Major depressive disorder, single episode, unspecified: Secondary | ICD-10-CM | POA: Diagnosis not present

## 2016-05-26 DIAGNOSIS — R2689 Other abnormalities of gait and mobility: Secondary | ICD-10-CM | POA: Diagnosis not present

## 2016-05-26 DIAGNOSIS — R531 Weakness: Secondary | ICD-10-CM | POA: Diagnosis not present

## 2016-05-26 DIAGNOSIS — A4901 Methicillin susceptible Staphylococcus aureus infection, unspecified site: Secondary | ICD-10-CM | POA: Diagnosis not present

## 2016-05-30 DIAGNOSIS — R350 Frequency of micturition: Secondary | ICD-10-CM | POA: Diagnosis not present

## 2016-05-30 DIAGNOSIS — N401 Enlarged prostate with lower urinary tract symptoms: Secondary | ICD-10-CM | POA: Diagnosis not present

## 2016-05-30 DIAGNOSIS — R972 Elevated prostate specific antigen [PSA]: Secondary | ICD-10-CM | POA: Diagnosis not present

## 2016-05-31 DIAGNOSIS — R531 Weakness: Secondary | ICD-10-CM | POA: Diagnosis not present

## 2016-05-31 DIAGNOSIS — J441 Chronic obstructive pulmonary disease with (acute) exacerbation: Secondary | ICD-10-CM | POA: Diagnosis not present

## 2016-05-31 DIAGNOSIS — R2689 Other abnormalities of gait and mobility: Secondary | ICD-10-CM | POA: Diagnosis not present

## 2016-05-31 DIAGNOSIS — T827XXD Infection and inflammatory reaction due to other cardiac and vascular devices, implants and grafts, subsequent encounter: Secondary | ICD-10-CM | POA: Diagnosis not present

## 2016-05-31 DIAGNOSIS — A4901 Methicillin susceptible Staphylococcus aureus infection, unspecified site: Secondary | ICD-10-CM | POA: Diagnosis not present

## 2016-05-31 DIAGNOSIS — F329 Major depressive disorder, single episode, unspecified: Secondary | ICD-10-CM | POA: Diagnosis not present

## 2016-06-06 DIAGNOSIS — I776 Arteritis, unspecified: Secondary | ICD-10-CM | POA: Diagnosis not present

## 2016-06-06 DIAGNOSIS — R339 Retention of urine, unspecified: Secondary | ICD-10-CM | POA: Diagnosis not present

## 2016-06-06 DIAGNOSIS — Z79899 Other long term (current) drug therapy: Secondary | ICD-10-CM | POA: Diagnosis not present

## 2016-06-06 DIAGNOSIS — Z87891 Personal history of nicotine dependence: Secondary | ICD-10-CM | POA: Diagnosis not present

## 2016-06-06 DIAGNOSIS — T827XXD Infection and inflammatory reaction due to other cardiac and vascular devices, implants and grafts, subsequent encounter: Secondary | ICD-10-CM | POA: Diagnosis not present

## 2016-06-06 DIAGNOSIS — E785 Hyperlipidemia, unspecified: Secondary | ICD-10-CM | POA: Diagnosis not present

## 2016-06-06 DIAGNOSIS — Z7982 Long term (current) use of aspirin: Secondary | ICD-10-CM | POA: Diagnosis not present

## 2016-06-06 DIAGNOSIS — Z888 Allergy status to other drugs, medicaments and biological substances status: Secondary | ICD-10-CM | POA: Diagnosis not present

## 2016-06-06 DIAGNOSIS — A4902 Methicillin resistant Staphylococcus aureus infection, unspecified site: Secondary | ICD-10-CM | POA: Diagnosis not present

## 2016-06-22 DIAGNOSIS — R531 Weakness: Secondary | ICD-10-CM | POA: Diagnosis not present

## 2016-06-22 DIAGNOSIS — F329 Major depressive disorder, single episode, unspecified: Secondary | ICD-10-CM | POA: Diagnosis not present

## 2016-06-22 DIAGNOSIS — R2689 Other abnormalities of gait and mobility: Secondary | ICD-10-CM | POA: Diagnosis not present

## 2016-06-22 DIAGNOSIS — T827XXD Infection and inflammatory reaction due to other cardiac and vascular devices, implants and grafts, subsequent encounter: Secondary | ICD-10-CM | POA: Diagnosis not present

## 2016-06-22 DIAGNOSIS — J441 Chronic obstructive pulmonary disease with (acute) exacerbation: Secondary | ICD-10-CM | POA: Diagnosis not present

## 2016-06-22 DIAGNOSIS — A4901 Methicillin susceptible Staphylococcus aureus infection, unspecified site: Secondary | ICD-10-CM | POA: Diagnosis not present

## 2016-06-28 DIAGNOSIS — R2689 Other abnormalities of gait and mobility: Secondary | ICD-10-CM | POA: Diagnosis not present

## 2016-06-28 DIAGNOSIS — A4901 Methicillin susceptible Staphylococcus aureus infection, unspecified site: Secondary | ICD-10-CM | POA: Diagnosis not present

## 2016-06-28 DIAGNOSIS — R531 Weakness: Secondary | ICD-10-CM | POA: Diagnosis not present

## 2016-06-28 DIAGNOSIS — F329 Major depressive disorder, single episode, unspecified: Secondary | ICD-10-CM | POA: Diagnosis not present

## 2016-06-28 DIAGNOSIS — J441 Chronic obstructive pulmonary disease with (acute) exacerbation: Secondary | ICD-10-CM | POA: Diagnosis not present

## 2016-06-28 DIAGNOSIS — T827XXD Infection and inflammatory reaction due to other cardiac and vascular devices, implants and grafts, subsequent encounter: Secondary | ICD-10-CM | POA: Diagnosis not present

## 2016-06-30 DIAGNOSIS — R2689 Other abnormalities of gait and mobility: Secondary | ICD-10-CM | POA: Diagnosis not present

## 2016-06-30 DIAGNOSIS — J441 Chronic obstructive pulmonary disease with (acute) exacerbation: Secondary | ICD-10-CM | POA: Diagnosis not present

## 2016-06-30 DIAGNOSIS — R531 Weakness: Secondary | ICD-10-CM | POA: Diagnosis not present

## 2016-06-30 DIAGNOSIS — F329 Major depressive disorder, single episode, unspecified: Secondary | ICD-10-CM | POA: Diagnosis not present

## 2016-06-30 DIAGNOSIS — T827XXD Infection and inflammatory reaction due to other cardiac and vascular devices, implants and grafts, subsequent encounter: Secondary | ICD-10-CM | POA: Diagnosis not present

## 2016-06-30 DIAGNOSIS — A4901 Methicillin susceptible Staphylococcus aureus infection, unspecified site: Secondary | ICD-10-CM | POA: Diagnosis not present

## 2016-07-07 DIAGNOSIS — Z23 Encounter for immunization: Secondary | ICD-10-CM | POA: Diagnosis not present

## 2016-07-19 DIAGNOSIS — Z888 Allergy status to other drugs, medicaments and biological substances status: Secondary | ICD-10-CM | POA: Diagnosis not present

## 2016-07-19 DIAGNOSIS — Z23 Encounter for immunization: Secondary | ICD-10-CM | POA: Diagnosis not present

## 2016-07-19 DIAGNOSIS — I776 Arteritis, unspecified: Secondary | ICD-10-CM | POA: Diagnosis not present

## 2016-07-19 DIAGNOSIS — E784 Other hyperlipidemia: Secondary | ICD-10-CM | POA: Diagnosis not present

## 2016-07-19 DIAGNOSIS — Z79899 Other long term (current) drug therapy: Secondary | ICD-10-CM | POA: Diagnosis not present

## 2016-07-19 DIAGNOSIS — Z5181 Encounter for therapeutic drug level monitoring: Secondary | ICD-10-CM | POA: Diagnosis not present

## 2016-07-19 DIAGNOSIS — T827XXD Infection and inflammatory reaction due to other cardiac and vascular devices, implants and grafts, subsequent encounter: Secondary | ICD-10-CM | POA: Diagnosis not present

## 2016-07-19 DIAGNOSIS — B9561 Methicillin susceptible Staphylococcus aureus infection as the cause of diseases classified elsewhere: Secondary | ICD-10-CM | POA: Diagnosis not present

## 2016-08-02 DIAGNOSIS — Z87891 Personal history of nicotine dependence: Secondary | ICD-10-CM | POA: Diagnosis not present

## 2016-08-02 DIAGNOSIS — I728 Aneurysm of other specified arteries: Secondary | ICD-10-CM | POA: Diagnosis not present

## 2016-08-02 DIAGNOSIS — I700 Atherosclerosis of aorta: Secondary | ICD-10-CM | POA: Diagnosis not present

## 2016-08-02 DIAGNOSIS — M549 Dorsalgia, unspecified: Secondary | ICD-10-CM | POA: Diagnosis not present

## 2016-08-02 DIAGNOSIS — E785 Hyperlipidemia, unspecified: Secondary | ICD-10-CM | POA: Diagnosis not present

## 2016-08-02 DIAGNOSIS — S32038A Other fracture of third lumbar vertebra, initial encounter for closed fracture: Secondary | ICD-10-CM | POA: Diagnosis not present

## 2016-08-02 DIAGNOSIS — S2232XA Fracture of one rib, left side, initial encounter for closed fracture: Secondary | ICD-10-CM | POA: Diagnosis not present

## 2016-08-02 DIAGNOSIS — S2241XA Multiple fractures of ribs, right side, initial encounter for closed fracture: Secondary | ICD-10-CM | POA: Diagnosis not present

## 2016-08-02 DIAGNOSIS — W19XXXA Unspecified fall, initial encounter: Secondary | ICD-10-CM | POA: Diagnosis not present

## 2016-08-02 DIAGNOSIS — I491 Atrial premature depolarization: Secondary | ICD-10-CM | POA: Diagnosis not present

## 2016-08-02 DIAGNOSIS — S32039A Unspecified fracture of third lumbar vertebra, initial encounter for closed fracture: Secondary | ICD-10-CM | POA: Diagnosis not present

## 2016-08-02 DIAGNOSIS — S32029A Unspecified fracture of second lumbar vertebra, initial encounter for closed fracture: Secondary | ICD-10-CM | POA: Diagnosis not present

## 2016-08-02 DIAGNOSIS — I498 Other specified cardiac arrhythmias: Secondary | ICD-10-CM | POA: Diagnosis not present

## 2016-08-02 DIAGNOSIS — I4510 Unspecified right bundle-branch block: Secondary | ICD-10-CM | POA: Diagnosis not present

## 2016-08-02 DIAGNOSIS — S42031A Displaced fracture of lateral end of right clavicle, initial encounter for closed fracture: Secondary | ICD-10-CM | POA: Diagnosis not present

## 2016-08-02 DIAGNOSIS — M5144 Schmorl's nodes, thoracic region: Secondary | ICD-10-CM | POA: Diagnosis not present

## 2016-08-02 DIAGNOSIS — I714 Abdominal aortic aneurysm, without rupture: Secondary | ICD-10-CM | POA: Diagnosis not present

## 2016-08-02 DIAGNOSIS — S32028A Other fracture of second lumbar vertebra, initial encounter for closed fracture: Secondary | ICD-10-CM | POA: Diagnosis not present

## 2016-08-02 DIAGNOSIS — N400 Enlarged prostate without lower urinary tract symptoms: Secondary | ICD-10-CM | POA: Diagnosis not present

## 2016-08-02 DIAGNOSIS — F0390 Unspecified dementia without behavioral disturbance: Secondary | ICD-10-CM | POA: Diagnosis not present

## 2016-08-02 DIAGNOSIS — I499 Cardiac arrhythmia, unspecified: Secondary | ICD-10-CM | POA: Diagnosis not present

## 2016-08-02 DIAGNOSIS — S2243XA Multiple fractures of ribs, bilateral, initial encounter for closed fracture: Secondary | ICD-10-CM | POA: Diagnosis not present

## 2016-08-03 DIAGNOSIS — S32028A Other fracture of second lumbar vertebra, initial encounter for closed fracture: Secondary | ICD-10-CM | POA: Diagnosis not present

## 2016-08-03 DIAGNOSIS — S32038A Other fracture of third lumbar vertebra, initial encounter for closed fracture: Secondary | ICD-10-CM | POA: Diagnosis not present

## 2016-08-03 DIAGNOSIS — S2232XA Fracture of one rib, left side, initial encounter for closed fracture: Secondary | ICD-10-CM | POA: Diagnosis not present

## 2016-08-03 DIAGNOSIS — S42031A Displaced fracture of lateral end of right clavicle, initial encounter for closed fracture: Secondary | ICD-10-CM | POA: Diagnosis not present

## 2016-08-04 DIAGNOSIS — S32028A Other fracture of second lumbar vertebra, initial encounter for closed fracture: Secondary | ICD-10-CM | POA: Diagnosis not present

## 2016-08-04 DIAGNOSIS — S2232XA Fracture of one rib, left side, initial encounter for closed fracture: Secondary | ICD-10-CM | POA: Diagnosis not present

## 2016-08-04 DIAGNOSIS — S42031A Displaced fracture of lateral end of right clavicle, initial encounter for closed fracture: Secondary | ICD-10-CM | POA: Diagnosis not present

## 2016-08-04 DIAGNOSIS — S32038A Other fracture of third lumbar vertebra, initial encounter for closed fracture: Secondary | ICD-10-CM | POA: Diagnosis not present

## 2016-08-04 DIAGNOSIS — I728 Aneurysm of other specified arteries: Secondary | ICD-10-CM | POA: Diagnosis not present

## 2016-08-16 DIAGNOSIS — Z9181 History of falling: Secondary | ICD-10-CM | POA: Diagnosis not present

## 2016-08-16 DIAGNOSIS — E785 Hyperlipidemia, unspecified: Secondary | ICD-10-CM | POA: Diagnosis not present

## 2016-08-16 DIAGNOSIS — M545 Low back pain: Secondary | ICD-10-CM | POA: Diagnosis not present

## 2016-08-16 DIAGNOSIS — W108XXA Fall (on) (from) other stairs and steps, initial encounter: Secondary | ICD-10-CM | POA: Diagnosis not present

## 2016-08-16 DIAGNOSIS — I2510 Atherosclerotic heart disease of native coronary artery without angina pectoris: Secondary | ICD-10-CM | POA: Diagnosis not present

## 2016-09-21 DIAGNOSIS — R972 Elevated prostate specific antigen [PSA]: Secondary | ICD-10-CM | POA: Diagnosis not present

## 2016-09-21 DIAGNOSIS — R8299 Other abnormal findings in urine: Secondary | ICD-10-CM | POA: Diagnosis not present

## 2016-09-21 DIAGNOSIS — Z125 Encounter for screening for malignant neoplasm of prostate: Secondary | ICD-10-CM | POA: Diagnosis not present

## 2016-09-21 DIAGNOSIS — R8290 Unspecified abnormal findings in urine: Secondary | ICD-10-CM | POA: Diagnosis not present

## 2016-09-22 DIAGNOSIS — Z95828 Presence of other vascular implants and grafts: Secondary | ICD-10-CM | POA: Diagnosis not present

## 2016-09-22 DIAGNOSIS — I714 Abdominal aortic aneurysm, without rupture: Secondary | ICD-10-CM | POA: Diagnosis not present

## 2016-09-24 ENCOUNTER — Encounter: Payer: Self-pay | Admitting: *Deleted

## 2016-09-24 ENCOUNTER — Emergency Department (INDEPENDENT_AMBULATORY_CARE_PROVIDER_SITE_OTHER)
Admission: EM | Admit: 2016-09-24 | Discharge: 2016-09-24 | Disposition: A | Discharge status: Home / Self Care | Payer: Medicare Other | Source: Home / Self Care | Attending: Family Medicine | Admitting: Family Medicine

## 2016-09-24 DIAGNOSIS — S91031A Puncture wound without foreign body, right ankle, initial encounter: Secondary | ICD-10-CM | POA: Diagnosis not present

## 2016-09-24 NOTE — Discharge Instructions (Signed)
Wear ace wrap for 2 to 3 days until healed.  Elevate leg when possible.  Recommend obtaining medical support hose and wear daytime.

## 2016-09-24 NOTE — ED Triage Notes (Signed)
Pt's wife reports that he has a puncture wound on his RT ankle x last night. She is unsure how he got the wound. She reports that he was taking his socks off to get ready for bed when it happened. She has applied pressure and wrapping gauzes around it, but it continues to bleed. Tdap 2015.

## 2016-09-24 NOTE — ED Provider Notes (Signed)
[facility]    CSN: [id number] Arrival date & time: 09/24/16  1501     History   Chief Complaint Chief Complaint  Patient presents with  . Puncture Wound    HPI Michael Dillon is a 81 y.o. male.   Patient acquired a small puncture wound on his right ankle last night.  He takes Plavix, and has not been able to stop the bleeding.  Last Tdap 06/30/14.   The history is provided by the patient and the spouse.  Laceration  Location: right ankle. Length:  3mm Depth:  Through dermis Quality: straight   Bleeding: controlled with pressure   Laceration mechanism:  Unable to specify Pain details:    Severity:  No pain   Progression:  Resolved Foreign body present:  No foreign bodies Relieved by:  Pressure Worsened by:  Movement Tetanus status:  Up to date Associated symptoms: no rash, no redness, no swelling and no streaking     Past Medical History:  Diagnosis Date  . AAA (abdominal aortic aneurysm) (HCC)   . Aortic aneurysm (HCC)    2012  . Bacteremia due to Staphylococcus aureus   . Benign prostatic hypertrophy with lower urinary tract symptoms (LUTS)   . Calculus of kidney   . Calculus of ureter   . COPD exacerbation (HCC)   . Depression   . Elevated prostate specific antigen (PSA)   . H/O endovascular stent graft for abdominal aortic aneurysm   . Heart murmur   . History of fusion of cervical spine   . Hyperlipidemia   . Liver cyst   . Olecranon bursitis of right elbow   . Pyelonephritis   . Renal calculi   . Renal cyst   . Sepsis due to methicillin susceptible Staphylococcus aureus (HCC)   . Septic olecranon bursitis of right elbow   . Urinary frequency     Patient Active Problem List   Diagnosis Date Noted  . Right rib fracture 03/27/2016  . MSSA (methicillin susceptible Staphylococcus aureus) septicemia (HCC) 03/26/2016  . MSSA (methicillin susceptible Staphylococcus aureus) infection 03/26/2016  . Dementia without behavioral disturbance  03/26/2016  . Restrictive lung disease 03/26/2016  . Frequent falls 01/15/2016  . Abnormality of gait 01/15/2016  . Septic olecranon bursitis of right elbow 01/11/2016  . CAD in native artery 10/15/2014  . Calculus of kidney 10/15/2014  . Benign prostatic hyperplasia with urinary obstruction 12/09/2011  . Hyperlipidemia 10/29/2009  . LACERATION, HAND, RIGHT 10/29/2009    Past Surgical History:  Procedure Laterality Date  . ABDOMINAL AORTIC ANEURYSM REPAIR    . APPENDECTOMY    . CERVICAL DISC ARTHROPLASTY  2008  . KIDNEY STONE SURGERY  2010  . LITHOTRIPSY    . REVISION TOTAL SHOULDER ARTHROPLASTY Right 01/16/16       Home Medications    Prior to Admission medications   Medication Sig Start Date End Date Taking? Authorizing Provider  alfuzosin (UROXATRAL) 10 MG 24 hr tablet Take 10 mg by mouth at bedtime.    Yes Historical Provider, MD  aspirin EC 81 MG tablet Take 81 mg by mouth daily.   Yes Historical Provider, MD  atorvastatin (LIPITOR) 20 MG tablet Take 20 mg by mouth daily.   Yes Historical Provider, MD  Bilberry, Vaccinium myrtillus, (BILBERRY FRUIT PO) Take by mouth.   Yes Historical Provider, MD  cefadroxil (DURICEF) 500 MG capsule Take 500 mg by mouth 2 (two) times daily.   Yes Historical Provider, MD  clopidogrel (PLAVIX) 75 MG  tablet Take 75 mg by mouth daily.   Yes Historical Provider, MD  Coenzyme Q-10 200 MG CAPS Take 1 capsule by mouth daily.   Yes Historical Provider, MD  Garlic 1000 MG CAPS Take 1 capsule by mouth daily.   Yes Historical Provider, MD  albuterol (PROVENTIL HFA;VENTOLIN HFA) 108 (90 Base) MCG/ACT inhaler Inhale 2 puffs into the lungs every 4 (four) hours as needed. For COPD    Historical Provider, MD  b complex vitamins capsule Take 1 capsule by mouth daily.    Historical Provider, MD  budesonide-formoterol (SYMBICORT) 80-4.5 MCG/ACT inhaler Inhale 2 puffs into the lungs 2 (two) times daily. For COPD    Historical Provider, MD  donepezil (ARICEPT)  5 MG tablet Take 5 mg by mouth at bedtime.     Historical Provider, MD  ENSURE (ENSURE) Take 1 Can by mouth 2 (two) times daily between meals.    Historical Provider, MD  finasteride (PROSCAR) 5 MG tablet Take 5 mg by mouth daily.     Historical Provider, MD  minocycline (DYNACIN) 100 MG tablet Take 100 mg by mouth 2 (two) times daily.    Historical Provider, MD  potassium chloride (MICRO-K) 10 MEQ CR capsule Take 10 mEq by mouth daily.    Historical Provider, MD  rifampin (RIFADIN) 300 MG capsule Take 300 mg by mouth 2 (two) times daily.    Historical Provider, MD  Vitamins-Lipotropics (LIPO-FLAVONOID PLUS) TABS Take by mouth. 200-100 mg tab daily    Historical Provider, MD    Family History Family History  Problem Relation Age of Onset  . Gastric cancer Father   . Stroke Father   . Aortic aneurysm Mother   . Lung disease Sister   . Hypertension Sister   . Sleep apnea Sister     Social History Social History  Substance Use Topics  . Smoking status: Former Smoker    Packs/day: 1.00    Years: 40.00    Types: Cigarettes    Quit date: 12/08/1996  . Smokeless tobacco: Never Used  . Alcohol use 0.0 oz/week     Comment: 1-3 glasses of wine weekly     Allergies   Patient has no known allergies.   Review of Systems Review of Systems  Skin: Negative for rash.  All other systems reviewed and are negative.    Physical Exam Triage Vital Signs ED Triage Vitals  Enc Vitals Group     BP 09/24/16 1528 146/80     Pulse Rate 09/24/16 1528 62     Resp 09/24/16 1528 16     Temp 09/24/16 1528 98.3 F (36.8 C)     Temp Source 09/24/16 1528 Oral     SpO2 09/24/16 1528 96 %     Weight 09/24/16 1529 143 lb (64.9 kg)     Height 09/24/16 1529 5\' 1"  (1.549 m)     Head Circumference --      Peak Flow --      Pain Score 09/24/16 1534 0     Pain Loc --      Pain Edu? --      Excl. in GC? --    No data found.   Updated Vital Signs BP 146/80 (BP Location: Left Arm)   Pulse 62    Temp 98.3 F (36.8 C) (Oral)   Resp 16   Ht 5\' 1"  (1.549 m)   Wt 143 lb (64.9 kg)   SpO2 96%   BMI 27.02 kg/m   Visual Acuity  Right Eye Distance:   Left Eye Distance:   Bilateral Distance:    Right Eye Near:   Left Eye Near:    Bilateral Near:     Physical Exam  Constitutional: He appears well-developed and well-nourished. No distress.  HENT:  Head: Normocephalic.  Eyes: Pupils are equal, round, and reactive to light.  Neck: Normal range of motion.  Cardiovascular: Normal heart sounds.   Pulmonary/Chest: Effort normal.  Musculoskeletal:       Right ankle: He exhibits laceration. He exhibits normal range of motion.       Feet:  Right medial ankle has a minimal 2 to 3mm superficial abrasion below the medial malleolus.  Wound appears to be over a superficial vein.  No bleeding present.  No sutures indicated.  No erythema or warmth.  Neurological: He is alert.  Skin: Skin is warm and dry.  Nursing note and vitals reviewed.    UC Treatments / Results  Labs (all labs ordered are listed, but only abnormal results are displayed) Labs Reviewed - No data to display  EKG  EKG Interpretation None       Radiology No results found.  Procedures Procedures  Wound cleansed with saline.  Non-stick dressing applied followed by ace wrap in graduated compression fashion.  Medications Ordered in UC Medications - No data to display   Initial Impression / Assessment and Plan / UC Course  I have reviewed the triage vital signs and the nursing notes.  Pertinent labs & imaging results that were available during my care of the patient were reviewed by me and considered in my medical decision making (see chart for details).  Clinical Course   Suspect a small tear of superficial vein; hemostasis now achieved.  No evidence cellulitis. Wear ace wrap for 2 to 3 days until healed.  Elevate leg when possible.  Recommend obtaining medical support hose and wear daytime.     Final  Clinical Impressions(s) / UC Diagnoses   Final diagnoses:  Puncture wound of right ankle, initial encounter    New Prescriptions New Prescriptions   No medications on file     Lattie HawStephen A Retta Pitcher, MD 10/11/16 1446

## 2016-10-13 DIAGNOSIS — R296 Repeated falls: Secondary | ICD-10-CM | POA: Diagnosis not present

## 2016-10-28 DIAGNOSIS — R262 Difficulty in walking, not elsewhere classified: Secondary | ICD-10-CM | POA: Diagnosis not present

## 2016-10-28 DIAGNOSIS — Z7982 Long term (current) use of aspirin: Secondary | ICD-10-CM | POA: Diagnosis not present

## 2016-10-28 DIAGNOSIS — R413 Other amnesia: Secondary | ICD-10-CM | POA: Diagnosis not present

## 2016-10-28 DIAGNOSIS — I2510 Atherosclerotic heart disease of native coronary artery without angina pectoris: Secondary | ICD-10-CM | POA: Diagnosis not present

## 2016-10-28 DIAGNOSIS — Z79899 Other long term (current) drug therapy: Secondary | ICD-10-CM | POA: Diagnosis not present

## 2016-10-28 DIAGNOSIS — M48061 Spinal stenosis, lumbar region without neurogenic claudication: Secondary | ICD-10-CM | POA: Diagnosis not present

## 2016-10-28 DIAGNOSIS — E784 Other hyperlipidemia: Secondary | ICD-10-CM | POA: Diagnosis not present

## 2016-10-28 DIAGNOSIS — Z888 Allergy status to other drugs, medicaments and biological substances status: Secondary | ICD-10-CM | POA: Diagnosis not present

## 2016-10-28 DIAGNOSIS — R296 Repeated falls: Secondary | ICD-10-CM | POA: Diagnosis not present

## 2016-10-28 DIAGNOSIS — Z87891 Personal history of nicotine dependence: Secondary | ICD-10-CM | POA: Diagnosis not present

## 2016-11-21 DIAGNOSIS — Z9989 Dependence on other enabling machines and devices: Secondary | ICD-10-CM | POA: Diagnosis not present

## 2016-11-21 DIAGNOSIS — Z87891 Personal history of nicotine dependence: Secondary | ICD-10-CM | POA: Diagnosis not present

## 2016-11-21 DIAGNOSIS — Z9889 Other specified postprocedural states: Secondary | ICD-10-CM | POA: Diagnosis not present

## 2016-11-21 DIAGNOSIS — R2689 Other abnormalities of gait and mobility: Secondary | ICD-10-CM | POA: Diagnosis not present

## 2016-11-21 DIAGNOSIS — R262 Difficulty in walking, not elsewhere classified: Secondary | ICD-10-CM | POA: Diagnosis not present

## 2016-11-21 DIAGNOSIS — R531 Weakness: Secondary | ICD-10-CM | POA: Diagnosis not present

## 2016-11-22 DIAGNOSIS — I776 Arteritis, unspecified: Secondary | ICD-10-CM | POA: Diagnosis not present

## 2016-11-22 DIAGNOSIS — Z79899 Other long term (current) drug therapy: Secondary | ICD-10-CM | POA: Diagnosis not present

## 2016-11-22 DIAGNOSIS — Z5181 Encounter for therapeutic drug level monitoring: Secondary | ICD-10-CM | POA: Diagnosis not present

## 2016-11-22 DIAGNOSIS — Z888 Allergy status to other drugs, medicaments and biological substances status: Secondary | ICD-10-CM | POA: Diagnosis not present

## 2016-11-22 DIAGNOSIS — R7881 Bacteremia: Secondary | ICD-10-CM | POA: Diagnosis not present

## 2016-11-22 DIAGNOSIS — T827XXD Infection and inflammatory reaction due to other cardiac and vascular devices, implants and grafts, subsequent encounter: Secondary | ICD-10-CM | POA: Diagnosis not present

## 2016-11-22 DIAGNOSIS — E784 Other hyperlipidemia: Secondary | ICD-10-CM | POA: Diagnosis not present

## 2016-11-22 DIAGNOSIS — B9561 Methicillin susceptible Staphylococcus aureus infection as the cause of diseases classified elsewhere: Secondary | ICD-10-CM | POA: Diagnosis not present

## 2016-12-20 DIAGNOSIS — Z888 Allergy status to other drugs, medicaments and biological substances status: Secondary | ICD-10-CM | POA: Diagnosis not present

## 2016-12-20 DIAGNOSIS — Z79899 Other long term (current) drug therapy: Secondary | ICD-10-CM | POA: Diagnosis not present

## 2016-12-20 DIAGNOSIS — E785 Hyperlipidemia, unspecified: Secondary | ICD-10-CM | POA: Diagnosis not present

## 2016-12-20 DIAGNOSIS — R972 Elevated prostate specific antigen [PSA]: Secondary | ICD-10-CM | POA: Diagnosis not present

## 2016-12-20 DIAGNOSIS — G301 Alzheimer's disease with late onset: Secondary | ICD-10-CM | POA: Diagnosis not present

## 2016-12-20 DIAGNOSIS — N400 Enlarged prostate without lower urinary tract symptoms: Secondary | ICD-10-CM | POA: Diagnosis not present

## 2016-12-20 DIAGNOSIS — Z955 Presence of coronary angioplasty implant and graft: Secondary | ICD-10-CM | POA: Diagnosis not present

## 2016-12-20 DIAGNOSIS — D509 Iron deficiency anemia, unspecified: Secondary | ICD-10-CM | POA: Diagnosis not present

## 2016-12-20 DIAGNOSIS — Z7902 Long term (current) use of antithrombotics/antiplatelets: Secondary | ICD-10-CM | POA: Diagnosis not present

## 2016-12-20 DIAGNOSIS — Z139 Encounter for screening, unspecified: Secondary | ICD-10-CM | POA: Diagnosis not present

## 2016-12-20 DIAGNOSIS — F0390 Unspecified dementia without behavioral disturbance: Secondary | ICD-10-CM | POA: Diagnosis not present

## 2016-12-20 DIAGNOSIS — Z7982 Long term (current) use of aspirin: Secondary | ICD-10-CM | POA: Diagnosis not present

## 2016-12-20 DIAGNOSIS — I714 Abdominal aortic aneurysm, without rupture: Secondary | ICD-10-CM | POA: Diagnosis not present

## 2016-12-20 DIAGNOSIS — Z792 Long term (current) use of antibiotics: Secondary | ICD-10-CM | POA: Diagnosis not present

## 2016-12-20 DIAGNOSIS — I719 Aortic aneurysm of unspecified site, without rupture: Secondary | ICD-10-CM | POA: Diagnosis not present

## 2016-12-20 DIAGNOSIS — I2510 Atherosclerotic heart disease of native coronary artery without angina pectoris: Secondary | ICD-10-CM | POA: Diagnosis not present

## 2016-12-20 DIAGNOSIS — F0281 Dementia in other diseases classified elsewhere with behavioral disturbance: Secondary | ICD-10-CM | POA: Diagnosis not present

## 2016-12-20 DIAGNOSIS — R296 Repeated falls: Secondary | ICD-10-CM | POA: Diagnosis not present

## 2017-01-05 DIAGNOSIS — M545 Low back pain: Secondary | ICD-10-CM | POA: Diagnosis not present

## 2017-01-05 DIAGNOSIS — M48061 Spinal stenosis, lumbar region without neurogenic claudication: Secondary | ICD-10-CM | POA: Diagnosis not present

## 2017-01-05 DIAGNOSIS — M4716 Other spondylosis with myelopathy, lumbar region: Secondary | ICD-10-CM | POA: Diagnosis not present

## 2017-01-09 DIAGNOSIS — G301 Alzheimer's disease with late onset: Secondary | ICD-10-CM | POA: Diagnosis not present

## 2017-01-09 DIAGNOSIS — R296 Repeated falls: Secondary | ICD-10-CM | POA: Diagnosis not present

## 2017-01-09 DIAGNOSIS — Z7409 Other reduced mobility: Secondary | ICD-10-CM | POA: Diagnosis not present

## 2017-01-09 DIAGNOSIS — F0281 Dementia in other diseases classified elsewhere with behavioral disturbance: Secondary | ICD-10-CM | POA: Diagnosis not present

## 2017-01-09 DIAGNOSIS — Z139 Encounter for screening, unspecified: Secondary | ICD-10-CM | POA: Diagnosis not present

## 2017-01-12 DIAGNOSIS — N401 Enlarged prostate with lower urinary tract symptoms: Secondary | ICD-10-CM | POA: Diagnosis not present

## 2017-01-12 DIAGNOSIS — R972 Elevated prostate specific antigen [PSA]: Secondary | ICD-10-CM | POA: Diagnosis not present

## 2017-01-17 DIAGNOSIS — F0281 Dementia in other diseases classified elsewhere with behavioral disturbance: Secondary | ICD-10-CM | POA: Diagnosis not present

## 2017-01-17 DIAGNOSIS — R296 Repeated falls: Secondary | ICD-10-CM | POA: Diagnosis not present

## 2017-01-17 DIAGNOSIS — G301 Alzheimer's disease with late onset: Secondary | ICD-10-CM | POA: Diagnosis not present

## 2017-01-17 DIAGNOSIS — Z139 Encounter for screening, unspecified: Secondary | ICD-10-CM | POA: Diagnosis not present

## 2017-01-17 DIAGNOSIS — Z7409 Other reduced mobility: Secondary | ICD-10-CM | POA: Diagnosis not present

## 2017-01-25 DIAGNOSIS — Z139 Encounter for screening, unspecified: Secondary | ICD-10-CM | POA: Diagnosis not present

## 2017-01-25 DIAGNOSIS — F0281 Dementia in other diseases classified elsewhere with behavioral disturbance: Secondary | ICD-10-CM | POA: Diagnosis not present

## 2017-01-25 DIAGNOSIS — Z7409 Other reduced mobility: Secondary | ICD-10-CM | POA: Diagnosis not present

## 2017-01-25 DIAGNOSIS — R296 Repeated falls: Secondary | ICD-10-CM | POA: Diagnosis not present

## 2017-01-25 DIAGNOSIS — G301 Alzheimer's disease with late onset: Secondary | ICD-10-CM | POA: Diagnosis not present

## 2017-01-27 DIAGNOSIS — G301 Alzheimer's disease with late onset: Secondary | ICD-10-CM | POA: Diagnosis not present

## 2017-01-27 DIAGNOSIS — F0281 Dementia in other diseases classified elsewhere with behavioral disturbance: Secondary | ICD-10-CM | POA: Diagnosis not present

## 2017-01-27 DIAGNOSIS — Z139 Encounter for screening, unspecified: Secondary | ICD-10-CM | POA: Diagnosis not present

## 2017-01-27 DIAGNOSIS — R296 Repeated falls: Secondary | ICD-10-CM | POA: Diagnosis not present

## 2017-01-27 DIAGNOSIS — Z7409 Other reduced mobility: Secondary | ICD-10-CM | POA: Diagnosis not present

## 2017-02-01 DIAGNOSIS — G301 Alzheimer's disease with late onset: Secondary | ICD-10-CM | POA: Diagnosis not present

## 2017-02-01 DIAGNOSIS — R296 Repeated falls: Secondary | ICD-10-CM | POA: Diagnosis not present

## 2017-02-01 DIAGNOSIS — F0281 Dementia in other diseases classified elsewhere with behavioral disturbance: Secondary | ICD-10-CM | POA: Diagnosis not present

## 2017-02-01 DIAGNOSIS — Z7409 Other reduced mobility: Secondary | ICD-10-CM | POA: Diagnosis not present

## 2017-02-01 DIAGNOSIS — Z139 Encounter for screening, unspecified: Secondary | ICD-10-CM | POA: Diagnosis not present

## 2017-02-03 DIAGNOSIS — Z7409 Other reduced mobility: Secondary | ICD-10-CM | POA: Diagnosis not present

## 2017-02-03 DIAGNOSIS — G301 Alzheimer's disease with late onset: Secondary | ICD-10-CM | POA: Diagnosis not present

## 2017-02-03 DIAGNOSIS — Z139 Encounter for screening, unspecified: Secondary | ICD-10-CM | POA: Diagnosis not present

## 2017-02-03 DIAGNOSIS — R296 Repeated falls: Secondary | ICD-10-CM | POA: Diagnosis not present

## 2017-02-03 DIAGNOSIS — F0281 Dementia in other diseases classified elsewhere with behavioral disturbance: Secondary | ICD-10-CM | POA: Diagnosis not present

## 2017-02-07 DIAGNOSIS — Z9181 History of falling: Secondary | ICD-10-CM | POA: Diagnosis not present

## 2017-02-07 DIAGNOSIS — Z79899 Other long term (current) drug therapy: Secondary | ICD-10-CM | POA: Diagnosis not present

## 2017-02-07 DIAGNOSIS — N401 Enlarged prostate with lower urinary tract symptoms: Secondary | ICD-10-CM | POA: Diagnosis not present

## 2017-02-07 DIAGNOSIS — I10 Essential (primary) hypertension: Secondary | ICD-10-CM | POA: Diagnosis not present

## 2017-02-07 DIAGNOSIS — F0390 Unspecified dementia without behavioral disturbance: Secondary | ICD-10-CM | POA: Diagnosis not present

## 2017-02-07 DIAGNOSIS — Z7902 Long term (current) use of antithrombotics/antiplatelets: Secondary | ICD-10-CM | POA: Diagnosis not present

## 2017-02-07 DIAGNOSIS — I2510 Atherosclerotic heart disease of native coronary artery without angina pectoris: Secondary | ICD-10-CM | POA: Diagnosis not present

## 2017-02-07 DIAGNOSIS — Z7982 Long term (current) use of aspirin: Secondary | ICD-10-CM | POA: Diagnosis not present

## 2017-02-07 DIAGNOSIS — I714 Abdominal aortic aneurysm, without rupture: Secondary | ICD-10-CM | POA: Diagnosis not present

## 2017-02-07 DIAGNOSIS — E785 Hyperlipidemia, unspecified: Secondary | ICD-10-CM | POA: Diagnosis not present

## 2017-02-07 DIAGNOSIS — N400 Enlarged prostate without lower urinary tract symptoms: Secondary | ICD-10-CM | POA: Diagnosis not present

## 2017-02-08 DIAGNOSIS — F0281 Dementia in other diseases classified elsewhere with behavioral disturbance: Secondary | ICD-10-CM | POA: Diagnosis not present

## 2017-02-08 DIAGNOSIS — G301 Alzheimer's disease with late onset: Secondary | ICD-10-CM | POA: Diagnosis not present

## 2017-02-08 DIAGNOSIS — Z139 Encounter for screening, unspecified: Secondary | ICD-10-CM | POA: Diagnosis not present

## 2017-02-08 DIAGNOSIS — R296 Repeated falls: Secondary | ICD-10-CM | POA: Diagnosis not present

## 2017-02-08 DIAGNOSIS — Z7409 Other reduced mobility: Secondary | ICD-10-CM | POA: Diagnosis not present

## 2017-02-15 DIAGNOSIS — Z7409 Other reduced mobility: Secondary | ICD-10-CM | POA: Diagnosis not present

## 2017-02-15 DIAGNOSIS — F0281 Dementia in other diseases classified elsewhere with behavioral disturbance: Secondary | ICD-10-CM | POA: Diagnosis not present

## 2017-02-15 DIAGNOSIS — Z139 Encounter for screening, unspecified: Secondary | ICD-10-CM | POA: Diagnosis not present

## 2017-02-15 DIAGNOSIS — G301 Alzheimer's disease with late onset: Secondary | ICD-10-CM | POA: Diagnosis not present

## 2017-02-15 DIAGNOSIS — R296 Repeated falls: Secondary | ICD-10-CM | POA: Diagnosis not present

## 2017-02-17 DIAGNOSIS — E039 Hypothyroidism, unspecified: Secondary | ICD-10-CM | POA: Diagnosis not present

## 2017-02-17 DIAGNOSIS — Z79899 Other long term (current) drug therapy: Secondary | ICD-10-CM | POA: Diagnosis not present

## 2017-02-17 DIAGNOSIS — R296 Repeated falls: Secondary | ICD-10-CM | POA: Diagnosis not present

## 2017-02-17 DIAGNOSIS — G737 Myopathy in diseases classified elsewhere: Secondary | ICD-10-CM | POA: Diagnosis not present

## 2017-02-17 DIAGNOSIS — G9589 Other specified diseases of spinal cord: Secondary | ICD-10-CM | POA: Diagnosis not present

## 2017-02-17 DIAGNOSIS — M48061 Spinal stenosis, lumbar region without neurogenic claudication: Secondary | ICD-10-CM | POA: Diagnosis not present

## 2017-02-17 DIAGNOSIS — R258 Other abnormal involuntary movements: Secondary | ICD-10-CM | POA: Diagnosis not present

## 2017-02-17 DIAGNOSIS — R413 Other amnesia: Secondary | ICD-10-CM | POA: Diagnosis not present

## 2017-02-17 DIAGNOSIS — R262 Difficulty in walking, not elsewhere classified: Secondary | ICD-10-CM | POA: Diagnosis not present

## 2017-02-17 DIAGNOSIS — R292 Abnormal reflex: Secondary | ICD-10-CM | POA: Diagnosis not present

## 2017-02-17 DIAGNOSIS — Z9181 History of falling: Secondary | ICD-10-CM | POA: Diagnosis not present

## 2017-02-24 DIAGNOSIS — G301 Alzheimer's disease with late onset: Secondary | ICD-10-CM | POA: Diagnosis not present

## 2017-02-24 DIAGNOSIS — F0281 Dementia in other diseases classified elsewhere with behavioral disturbance: Secondary | ICD-10-CM | POA: Diagnosis not present

## 2017-02-24 DIAGNOSIS — Z7409 Other reduced mobility: Secondary | ICD-10-CM | POA: Diagnosis not present

## 2017-02-24 DIAGNOSIS — R296 Repeated falls: Secondary | ICD-10-CM | POA: Diagnosis not present

## 2017-03-03 DIAGNOSIS — S42011A Anterior displaced fracture of sternal end of right clavicle, initial encounter for closed fracture: Secondary | ICD-10-CM | POA: Diagnosis not present

## 2017-03-03 DIAGNOSIS — F0390 Unspecified dementia without behavioral disturbance: Secondary | ICD-10-CM | POA: Diagnosis not present

## 2017-03-03 DIAGNOSIS — Y998 Other external cause status: Secondary | ICD-10-CM | POA: Diagnosis not present

## 2017-03-03 DIAGNOSIS — M19012 Primary osteoarthritis, left shoulder: Secondary | ICD-10-CM | POA: Diagnosis not present

## 2017-03-03 DIAGNOSIS — S066X9A Traumatic subarachnoid hemorrhage with loss of consciousness of unspecified duration, initial encounter: Secondary | ICD-10-CM | POA: Diagnosis not present

## 2017-03-03 DIAGNOSIS — S098XXA Other specified injuries of head, initial encounter: Secondary | ICD-10-CM | POA: Diagnosis not present

## 2017-03-03 DIAGNOSIS — M542 Cervicalgia: Secondary | ICD-10-CM | POA: Diagnosis not present

## 2017-03-03 DIAGNOSIS — R410 Disorientation, unspecified: Secondary | ICD-10-CM | POA: Diagnosis not present

## 2017-03-03 DIAGNOSIS — T827XXA Infection and inflammatory reaction due to other cardiac and vascular devices, implants and grafts, initial encounter: Secondary | ICD-10-CM | POA: Diagnosis not present

## 2017-03-03 DIAGNOSIS — S42002A Fracture of unspecified part of left clavicle, initial encounter for closed fracture: Secondary | ICD-10-CM | POA: Diagnosis not present

## 2017-03-03 DIAGNOSIS — Z66 Do not resuscitate: Secondary | ICD-10-CM | POA: Diagnosis not present

## 2017-03-03 DIAGNOSIS — W19XXXA Unspecified fall, initial encounter: Secondary | ICD-10-CM | POA: Diagnosis not present

## 2017-03-03 DIAGNOSIS — M791 Myalgia: Secondary | ICD-10-CM | POA: Diagnosis not present

## 2017-03-03 DIAGNOSIS — E785 Hyperlipidemia, unspecified: Secondary | ICD-10-CM | POA: Diagnosis not present

## 2017-03-03 DIAGNOSIS — R55 Syncope and collapse: Secondary | ICD-10-CM | POA: Diagnosis not present

## 2017-03-03 DIAGNOSIS — S066X0A Traumatic subarachnoid hemorrhage without loss of consciousness, initial encounter: Secondary | ICD-10-CM | POA: Diagnosis not present

## 2017-03-03 DIAGNOSIS — S0180XA Unspecified open wound of other part of head, initial encounter: Secondary | ICD-10-CM | POA: Diagnosis not present

## 2017-03-03 DIAGNOSIS — Z9181 History of falling: Secondary | ICD-10-CM | POA: Diagnosis not present

## 2017-03-03 DIAGNOSIS — S42032A Displaced fracture of lateral end of left clavicle, initial encounter for closed fracture: Secondary | ICD-10-CM | POA: Diagnosis not present

## 2017-03-03 DIAGNOSIS — I609 Nontraumatic subarachnoid hemorrhage, unspecified: Secondary | ICD-10-CM | POA: Diagnosis not present

## 2017-03-03 DIAGNOSIS — N400 Enlarged prostate without lower urinary tract symptoms: Secondary | ICD-10-CM | POA: Diagnosis not present

## 2017-03-03 LAB — CBC AND DIFFERENTIAL
HCT: 37 — AB (ref 41–53)
HEMOGLOBIN: 12.8 — AB (ref 13.5–17.5)
PLATELETS: 220 (ref 150–399)
WBC: 16.4

## 2017-03-03 LAB — BASIC METABOLIC PANEL
BUN: 19 (ref 4–21)
Creatinine: 0.8 (ref 0.6–1.3)
GLUCOSE: 124
Potassium: 4.5 (ref 3.4–5.3)
SODIUM: 137 (ref 137–147)

## 2017-03-04 DIAGNOSIS — S066X9A Traumatic subarachnoid hemorrhage with loss of consciousness of unspecified duration, initial encounter: Secondary | ICD-10-CM | POA: Diagnosis not present

## 2017-03-04 DIAGNOSIS — R55 Syncope and collapse: Secondary | ICD-10-CM | POA: Diagnosis not present

## 2017-03-04 DIAGNOSIS — S42002A Fracture of unspecified part of left clavicle, initial encounter for closed fracture: Secondary | ICD-10-CM | POA: Diagnosis not present

## 2017-03-04 DIAGNOSIS — F0390 Unspecified dementia without behavioral disturbance: Secondary | ICD-10-CM | POA: Diagnosis not present

## 2017-03-04 DIAGNOSIS — I609 Nontraumatic subarachnoid hemorrhage, unspecified: Secondary | ICD-10-CM | POA: Diagnosis not present

## 2017-03-04 DIAGNOSIS — R9431 Abnormal electrocardiogram [ECG] [EKG]: Secondary | ICD-10-CM | POA: Diagnosis not present

## 2017-03-04 DIAGNOSIS — Z8619 Personal history of other infectious and parasitic diseases: Secondary | ICD-10-CM | POA: Diagnosis not present

## 2017-03-04 DIAGNOSIS — S066X0A Traumatic subarachnoid hemorrhage without loss of consciousness, initial encounter: Secondary | ICD-10-CM | POA: Diagnosis not present

## 2017-03-04 DIAGNOSIS — W19XXXA Unspecified fall, initial encounter: Secondary | ICD-10-CM | POA: Diagnosis not present

## 2017-03-04 LAB — BASIC METABOLIC PANEL
BUN: 18 (ref 4–21)
CREATININE: 0.8 (ref 0.6–1.3)
Glucose: 144
Potassium: 4.2 (ref 3.4–5.3)
Sodium: 136 — AB (ref 137–147)

## 2017-03-04 LAB — POCT INR: INR: 1.1 (ref 0.9–1.1)

## 2017-03-04 LAB — CBC AND DIFFERENTIAL
HCT: 34 — AB (ref 41–53)
Hemoglobin: 12.3 — AB (ref 13.5–17.5)
PLATELETS: 226 (ref 150–399)
WBC: 12.5

## 2017-03-05 DIAGNOSIS — J449 Chronic obstructive pulmonary disease, unspecified: Secondary | ICD-10-CM | POA: Diagnosis not present

## 2017-03-05 DIAGNOSIS — E785 Hyperlipidemia, unspecified: Secondary | ICD-10-CM | POA: Diagnosis present

## 2017-03-05 DIAGNOSIS — Z7902 Long term (current) use of antithrombotics/antiplatelets: Secondary | ICD-10-CM | POA: Diagnosis not present

## 2017-03-05 DIAGNOSIS — F0390 Unspecified dementia without behavioral disturbance: Secondary | ICD-10-CM | POA: Diagnosis not present

## 2017-03-05 DIAGNOSIS — I10 Essential (primary) hypertension: Secondary | ICD-10-CM | POA: Diagnosis present

## 2017-03-05 DIAGNOSIS — R296 Repeated falls: Secondary | ICD-10-CM | POA: Diagnosis present

## 2017-03-05 DIAGNOSIS — Z792 Long term (current) use of antibiotics: Secondary | ICD-10-CM | POA: Diagnosis not present

## 2017-03-05 DIAGNOSIS — Z7982 Long term (current) use of aspirin: Secondary | ICD-10-CM | POA: Diagnosis not present

## 2017-03-05 DIAGNOSIS — I609 Nontraumatic subarachnoid hemorrhage, unspecified: Secondary | ICD-10-CM | POA: Diagnosis not present

## 2017-03-05 DIAGNOSIS — Z8619 Personal history of other infectious and parasitic diseases: Secondary | ICD-10-CM | POA: Diagnosis not present

## 2017-03-05 DIAGNOSIS — S42012D Anterior displaced fracture of sternal end of left clavicle, subsequent encounter for fracture with routine healing: Secondary | ICD-10-CM | POA: Diagnosis not present

## 2017-03-05 DIAGNOSIS — R269 Unspecified abnormalities of gait and mobility: Secondary | ICD-10-CM | POA: Diagnosis present

## 2017-03-05 DIAGNOSIS — R402360 Coma scale, best motor response, obeys commands, unspecified time: Secondary | ICD-10-CM | POA: Diagnosis present

## 2017-03-05 DIAGNOSIS — R2681 Unsteadiness on feet: Secondary | ICD-10-CM | POA: Diagnosis not present

## 2017-03-05 DIAGNOSIS — Z888 Allergy status to other drugs, medicaments and biological substances status: Secondary | ICD-10-CM | POA: Diagnosis not present

## 2017-03-05 DIAGNOSIS — W19XXXA Unspecified fall, initial encounter: Secondary | ICD-10-CM | POA: Diagnosis not present

## 2017-03-05 DIAGNOSIS — T827XXA Infection and inflammatory reaction due to other cardiac and vascular devices, implants and grafts, initial encounter: Secondary | ICD-10-CM | POA: Diagnosis present

## 2017-03-05 DIAGNOSIS — S066X9A Traumatic subarachnoid hemorrhage with loss of consciousness of unspecified duration, initial encounter: Secondary | ICD-10-CM | POA: Diagnosis not present

## 2017-03-05 DIAGNOSIS — Z87891 Personal history of nicotine dependence: Secondary | ICD-10-CM | POA: Diagnosis not present

## 2017-03-05 DIAGNOSIS — I714 Abdominal aortic aneurysm, without rupture: Secondary | ICD-10-CM | POA: Diagnosis present

## 2017-03-05 DIAGNOSIS — Z79899 Other long term (current) drug therapy: Secondary | ICD-10-CM | POA: Diagnosis not present

## 2017-03-05 DIAGNOSIS — Z66 Do not resuscitate: Secondary | ICD-10-CM | POA: Diagnosis present

## 2017-03-05 DIAGNOSIS — S066X0D Traumatic subarachnoid hemorrhage without loss of consciousness, subsequent encounter: Secondary | ICD-10-CM | POA: Diagnosis not present

## 2017-03-05 DIAGNOSIS — R4182 Altered mental status, unspecified: Secondary | ICD-10-CM | POA: Diagnosis not present

## 2017-03-05 DIAGNOSIS — R488 Other symbolic dysfunctions: Secondary | ICD-10-CM | POA: Diagnosis not present

## 2017-03-05 DIAGNOSIS — I2510 Atherosclerotic heart disease of native coronary artery without angina pectoris: Secondary | ICD-10-CM | POA: Diagnosis present

## 2017-03-05 DIAGNOSIS — N400 Enlarged prostate without lower urinary tract symptoms: Secondary | ICD-10-CM | POA: Diagnosis present

## 2017-03-05 DIAGNOSIS — M6281 Muscle weakness (generalized): Secondary | ICD-10-CM | POA: Diagnosis not present

## 2017-03-05 DIAGNOSIS — Z7409 Other reduced mobility: Secondary | ICD-10-CM | POA: Diagnosis not present

## 2017-03-05 DIAGNOSIS — R402140 Coma scale, eyes open, spontaneous, unspecified time: Secondary | ICD-10-CM | POA: Diagnosis present

## 2017-03-05 DIAGNOSIS — Z87442 Personal history of urinary calculi: Secondary | ICD-10-CM | POA: Diagnosis not present

## 2017-03-05 DIAGNOSIS — Z9181 History of falling: Secondary | ICD-10-CM | POA: Diagnosis not present

## 2017-03-05 DIAGNOSIS — S065X9A Traumatic subdural hemorrhage with loss of consciousness of unspecified duration, initial encounter: Secondary | ICD-10-CM | POA: Diagnosis present

## 2017-03-05 DIAGNOSIS — S42002A Fracture of unspecified part of left clavicle, initial encounter for closed fracture: Secondary | ICD-10-CM | POA: Diagnosis not present

## 2017-03-05 DIAGNOSIS — R402250 Coma scale, best verbal response, oriented, unspecified time: Secondary | ICD-10-CM | POA: Diagnosis present

## 2017-03-05 DIAGNOSIS — F329 Major depressive disorder, single episode, unspecified: Secondary | ICD-10-CM | POA: Diagnosis present

## 2017-03-05 LAB — BASIC METABOLIC PANEL
BUN: 17 (ref 4–21)
CREATININE: 0.7 (ref 0.6–1.3)
Glucose: 99
Potassium: 4 (ref 3.4–5.3)
SODIUM: 138 (ref 137–147)

## 2017-03-05 LAB — CBC AND DIFFERENTIAL
HCT: 32 — AB (ref 41–53)
Hemoglobin: 11.3 — AB (ref 13.5–17.5)
PLATELETS: 202 (ref 150–399)
WBC: 7.8

## 2017-03-06 LAB — CBC AND DIFFERENTIAL
HCT: 31 — AB (ref 41–53)
Hemoglobin: 11.4 — AB (ref 13.5–17.5)
PLATELETS: 215 (ref 150–399)
WBC: 8

## 2017-03-06 LAB — BASIC METABOLIC PANEL
BUN: 16 (ref 4–21)
CREATININE: 0.7 (ref 0.6–1.3)
Glucose: 102
Potassium: 4.2 (ref 3.4–5.3)
Sodium: 139 (ref 137–147)

## 2017-03-08 DIAGNOSIS — S42002A Fracture of unspecified part of left clavicle, initial encounter for closed fracture: Secondary | ICD-10-CM | POA: Diagnosis not present

## 2017-03-08 DIAGNOSIS — R296 Repeated falls: Secondary | ICD-10-CM | POA: Diagnosis not present

## 2017-03-08 DIAGNOSIS — I714 Abdominal aortic aneurysm, without rupture: Secondary | ICD-10-CM | POA: Diagnosis not present

## 2017-03-08 DIAGNOSIS — R972 Elevated prostate specific antigen [PSA]: Secondary | ICD-10-CM | POA: Diagnosis not present

## 2017-03-08 DIAGNOSIS — A4901 Methicillin susceptible Staphylococcus aureus infection, unspecified site: Secondary | ICD-10-CM | POA: Diagnosis not present

## 2017-03-08 DIAGNOSIS — R488 Other symbolic dysfunctions: Secondary | ICD-10-CM | POA: Diagnosis not present

## 2017-03-08 DIAGNOSIS — Z95828 Presence of other vascular implants and grafts: Secondary | ICD-10-CM | POA: Diagnosis not present

## 2017-03-08 DIAGNOSIS — S42012D Anterior displaced fracture of sternal end of left clavicle, subsequent encounter for fracture with routine healing: Secondary | ICD-10-CM | POA: Diagnosis not present

## 2017-03-08 DIAGNOSIS — Z9181 History of falling: Secondary | ICD-10-CM | POA: Diagnosis not present

## 2017-03-08 DIAGNOSIS — F5101 Primary insomnia: Secondary | ICD-10-CM | POA: Diagnosis not present

## 2017-03-08 DIAGNOSIS — H532 Diplopia: Secondary | ICD-10-CM | POA: Diagnosis not present

## 2017-03-08 DIAGNOSIS — R4182 Altered mental status, unspecified: Secondary | ICD-10-CM | POA: Diagnosis not present

## 2017-03-08 DIAGNOSIS — S42011D Anterior displaced fracture of sternal end of right clavicle, subsequent encounter for fracture with routine healing: Secondary | ICD-10-CM | POA: Diagnosis not present

## 2017-03-08 DIAGNOSIS — N138 Other obstructive and reflux uropathy: Secondary | ICD-10-CM | POA: Diagnosis not present

## 2017-03-08 DIAGNOSIS — F0280 Dementia in other diseases classified elsewhere without behavioral disturbance: Secondary | ICD-10-CM | POA: Diagnosis not present

## 2017-03-08 DIAGNOSIS — N401 Enlarged prostate with lower urinary tract symptoms: Secondary | ICD-10-CM | POA: Diagnosis not present

## 2017-03-08 DIAGNOSIS — I2510 Atherosclerotic heart disease of native coronary artery without angina pectoris: Secondary | ICD-10-CM | POA: Diagnosis not present

## 2017-03-08 DIAGNOSIS — E785 Hyperlipidemia, unspecified: Secondary | ICD-10-CM | POA: Diagnosis not present

## 2017-03-08 DIAGNOSIS — S066X9A Traumatic subarachnoid hemorrhage with loss of consciousness of unspecified duration, initial encounter: Secondary | ICD-10-CM | POA: Diagnosis not present

## 2017-03-08 DIAGNOSIS — W19XXXA Unspecified fall, initial encounter: Secondary | ICD-10-CM | POA: Diagnosis not present

## 2017-03-08 DIAGNOSIS — S42022D Displaced fracture of shaft of left clavicle, subsequent encounter for fracture with routine healing: Secondary | ICD-10-CM | POA: Diagnosis not present

## 2017-03-08 DIAGNOSIS — J449 Chronic obstructive pulmonary disease, unspecified: Secondary | ICD-10-CM | POA: Diagnosis not present

## 2017-03-08 DIAGNOSIS — S066X0D Traumatic subarachnoid hemorrhage without loss of consciousness, subsequent encounter: Secondary | ICD-10-CM | POA: Diagnosis not present

## 2017-03-08 DIAGNOSIS — G301 Alzheimer's disease with late onset: Secondary | ICD-10-CM | POA: Diagnosis not present

## 2017-03-08 DIAGNOSIS — F0390 Unspecified dementia without behavioral disturbance: Secondary | ICD-10-CM | POA: Diagnosis not present

## 2017-03-08 DIAGNOSIS — R2681 Unsteadiness on feet: Secondary | ICD-10-CM | POA: Diagnosis not present

## 2017-03-08 DIAGNOSIS — I609 Nontraumatic subarachnoid hemorrhage, unspecified: Secondary | ICD-10-CM | POA: Diagnosis not present

## 2017-03-08 DIAGNOSIS — Z7409 Other reduced mobility: Secondary | ICD-10-CM | POA: Diagnosis not present

## 2017-03-08 DIAGNOSIS — M6281 Muscle weakness (generalized): Secondary | ICD-10-CM | POA: Diagnosis not present

## 2017-03-09 ENCOUNTER — Encounter: Payer: Self-pay | Admitting: Internal Medicine

## 2017-03-09 ENCOUNTER — Non-Acute Institutional Stay (SKILLED_NURSING_FACILITY): Payer: Medicare Other | Admitting: Internal Medicine

## 2017-03-09 DIAGNOSIS — N138 Other obstructive and reflux uropathy: Secondary | ICD-10-CM

## 2017-03-09 DIAGNOSIS — F0280 Dementia in other diseases classified elsewhere without behavioral disturbance: Secondary | ICD-10-CM

## 2017-03-09 DIAGNOSIS — I609 Nontraumatic subarachnoid hemorrhage, unspecified: Secondary | ICD-10-CM

## 2017-03-09 DIAGNOSIS — R296 Repeated falls: Secondary | ICD-10-CM | POA: Diagnosis not present

## 2017-03-09 DIAGNOSIS — S42022D Displaced fracture of shaft of left clavicle, subsequent encounter for fracture with routine healing: Secondary | ICD-10-CM | POA: Diagnosis not present

## 2017-03-09 DIAGNOSIS — E785 Hyperlipidemia, unspecified: Secondary | ICD-10-CM | POA: Diagnosis not present

## 2017-03-09 DIAGNOSIS — N401 Enlarged prostate with lower urinary tract symptoms: Secondary | ICD-10-CM | POA: Diagnosis not present

## 2017-03-09 DIAGNOSIS — G301 Alzheimer's disease with late onset: Secondary | ICD-10-CM

## 2017-03-09 DIAGNOSIS — I714 Abdominal aortic aneurysm, without rupture, unspecified: Secondary | ICD-10-CM

## 2017-03-09 DIAGNOSIS — Z95828 Presence of other vascular implants and grafts: Secondary | ICD-10-CM

## 2017-03-09 DIAGNOSIS — F5101 Primary insomnia: Secondary | ICD-10-CM

## 2017-03-09 NOTE — Progress Notes (Signed)
: Provider:  Randon Goldsmith. Lyn Hollingshead, MD Location:  [facility] Room Number: [phone] Place of Service:  SNF ([id number])  PCP: Teodoro Spray, MD Patient Care Team: Teodoro Spray, MD as PCP - General (Internal Medicine)  Extended Emergency Contact Information Primary Emergency Contact: [facility] Address: [address]          [place], [place] [address] Darden Amber of [place] Home Phone: [phone] Relation: Spouse     Allergies: Oxybutynin  Chief Complaint  Patient presents with  . New Admit To SNF    Admit to Facility    HPI: Patient is 81 y.o. male with jBPH, HLD, dementia, CAD on aspirin and Plavix, history of infected abdominal aortic aneurysm stented on chronic antibiotic D therapy who presented to [facility] after a fall. Patient was admitted to [facility] from 6/15-20 where he was found to have a small subarachnoid hemorrhage and a left-sided clavicular fracture. Patient was seen by neurology who recommended every 4 hour neuro checks and holding aspirin and Plavix. Patient has subsequent imaging which did not show any progression and patient had no worsening of neurologic symptoms. It was plan that patient should start back on aspirin and Plavix in one week which would be June 23 orthopedics saw the clavicular fracture recommended sling and weightbearing as tolerated along with physical therapy. Patient is admitted to skilled nursing facility for physical therapy for his subarachnoid hemorrhage and his clavicular fracture. While at skilled nursing facility patient will be followed for chronic infected aortic stent treated with Duricef , hyperlipidemia treated with Lipitor dementia treated with Aricept and BPH treated with Proscar.  Past Medical History:  Diagnosis Date  . AAA (abdominal aortic aneurysm) (HCC)   . Aortic aneurysm (HCC)    2012  . Bacteremia due to Staphylococcus aureus   . Benign prostatic hypertrophy with lower  urinary tract symptoms (LUTS)   . Calculus of kidney   . Calculus of ureter   . COPD exacerbation (HCC)   . Depression 06/18/2012   [facility] Wort for this  . Elevated prostate specific antigen (PSA)   . H/O endovascular stent graft for abdominal aortic aneurysm   . Heart murmur 06/18/2012   Echo 05/22/12 norm, no e/o valvular disease/dysfunction  . History of fusion of cervical spine 06/18/2012   2008 C4-7  . History of urinary retention   . Hyperlipidemia 06/18/2012  . Liver cyst   . Olecranon bursitis of right elbow   . Pyelonephritis   . Renal calculi   . Renal cyst    acquired  . Sepsis due to methicillin susceptible Staphylococcus aureus (HCC)   . Septic olecranon bursitis of right elbow   . Staphylococcus aureus bacteremia 02/2016   thought to infect aortic graft  . Urinary frequency     Past Surgical History:  Procedure Laterality Date  . ABDOMINAL AORTIC ANEURYSM REPAIR    . APPENDECTOMY    . CERVICAL DISC ARTHROPLASTY  2008  . KIDNEY STONE SURGERY  2010  . LITHOTRIPSY    . REVISION TOTAL SHOULDER ARTHROPLASTY Right 01/16/16    Allergies as of 03/09/2017      Reactions   Oxybutynin Anaphylaxis   Causes urinary retention      Medication List       Accurate as of 03/09/17  9:48 AM. Always use your most recent med list.          acetaminophen 325 MG tablet Commonly known as:  TYLENOL  Take 650 mg by mouth every 6 (six) hours as needed.   albuterol 108 (90 Base) MCG/ACT inhaler Commonly known as:  PROVENTIL HFA;VENTOLIN HFA Inhale 1 puff into the lungs every 6 (six) hours as needed. For COPD   alfuzosin 10 MG 24 hr tablet Commonly known as:  UROXATRAL Take 10 mg by mouth at bedtime.   aspirin EC 81 MG tablet Take 81 mg by mouth daily.   atorvastatin 40 MG tablet Commonly known as:  LIPITOR Take 40 mg by mouth daily.   cefadroxil 500 MG capsule Commonly known as:  DURICEF Take 500 mg by mouth 2 (two) times daily.   clopidogrel 75 MG  tablet Commonly known as:  PLAVIX Take 75 mg by mouth daily.   donepezil 5 MG tablet Commonly known as:  ARICEPT Take 5 mg by mouth at bedtime.   finasteride 5 MG tablet Commonly known as:  PROSCAR Take 5 mg by mouth daily.   Melatonin 3 MG Tabs Take 1 tablet by mouth at bedtime.   oxyCODONE 5 MG immediate release tablet Commonly known as:  Oxy IR/ROXICODONE Take 2.5 mg by mouth every 6 (six) hours as needed for severe pain. 1/2 tablet   Potassium 99 MG Tabs Take 1 tablet by mouth daily.   sennosides-docusate sodium 8.6-50 MG tablet Commonly known as:  SENOKOT-S Take 2 tablets by mouth at bedtime.       Meds ordered this encounter  Medications  . acetaminophen (TYLENOL) 325 MG tablet    Sig: Take 650 mg by mouth every 6 (six) hours as needed.  . Melatonin 3 MG TABS    Sig: Take 1 tablet by mouth at bedtime.  Marland Kitchen oxyCODONE (OXY IR/ROXICODONE) 5 MG immediate release tablet    Sig: Take 2.5 mg by mouth every 6 (six) hours as needed for severe pain. 1/2 tablet  . sennosides-docusate sodium (SENOKOT-S) 8.6-50 MG tablet    Sig: Take 2 tablets by mouth at bedtime.  Marland Kitchen atorvastatin (LIPITOR) 40 MG tablet    Sig: Take 40 mg by mouth daily.  . Potassium 99 MG TABS    Sig: Take 1 tablet by mouth daily.    Immunization History  Administered Date(s) Administered  . PPD Test 03/16/2016, 04/01/2016  . Tdap 06/28/2014    Social History  Substance Use Topics  . Smoking status: Former Smoker    Packs/day: 1.00    Years: 40.00    Types: Cigarettes    Quit date: 12/08/1996  . Smokeless tobacco: Never Used  . Alcohol use 0.0 oz/week     Comment: 1-3 glasses of wine weekly    Family history is   Family History  Problem Relation Age of Onset  . Gastric cancer Father   . Stroke Father   . Aortic aneurysm Mother   . Lung disease Sister   . Hypertension Sister   . Sleep apnea Sister       Review of Systems  DATA OBTAINED: from patient, nurse GENERAL:  no fevers,  fatigue, appetite changes SKIN: No itching, or rash EYES: No eye pain, redness, discharge EARS: No earache, tinnitus, change in hearing NOSE: No congestion, drainage or bleeding  MOUTH/THROAT: No mouth or tooth pain, No sore throat RESPIRATORY: No cough, wheezing, SOB CARDIAC: No chest pain, palpitations, lower extremity edema  GI: No abdominal pain, No N/V/D or constipation, No heartburn or reflux  GU: No dysuria, frequency or urgency, or incontinence  MUSCULOSKELETAL: No unrelieved bone/joint pain NEUROLOGIC: No headache, dizziness or focal  weakness PSYCHIATRIC: No c/o anxiety or sadness   Vitals:   03/09/17 0904  BP: (!) 142/87  Pulse: 85  Resp: 20  Temp: 98.4 F (36.9 C)    SpO2 Readings from Last 1 Encounters:  09/24/16 96%   Body mass index is 22.05 kg/m.     Physical Exam  GENERAL APPEARANCE: Alert,  No acute distress.  SKIN: No diaphoresis rash HEAD: Normocephalic, atraumatic  EYES: Conjunctiva/lids clear. Pupils round, reactive. EOMs intact.  EARS: External exam WNL, canals clear. Hearing grossly normal.  NOSE: No deformity or discharge.  MOUTH/THROAT: Lips w/o lesions  RESPIRATORY: Breathing is even, unlabored. Lung sounds are clear   CARDIOVASCULAR: Heart RRR no murmurs, rubs or gallops. No peripheral edema.   GASTROINTESTINAL: Abdomen is soft, non-tender, not distended w/ normal bowel sounds. GENITOURINARY: Bladder non tender, not distended  MUSCULOSKELETAL: No abnormal joints or musculature X mishapen L clavicle NEUROLOGIC:  Cranial nerves 2-12 grossly intact. Moves all extremities  PSYCHIATRIC: Mood and affect appropriate with de,mentia no behavioral issues  Patient Active Problem List   Diagnosis Date Noted  . Right rib fracture 03/27/2016  . MSSA (methicillin susceptible Staphylococcus aureus) septicemia (HCC) 03/26/2016  . MSSA (methicillin susceptible Staphylococcus aureus) infection 03/26/2016  . Dementia without behavioral disturbance  03/26/2016  . Restrictive lung disease 03/26/2016  . Frequent falls 01/15/2016  . Abnormality of gait 01/15/2016  . Septic olecranon bursitis of right elbow 01/11/2016  . CAD in native artery 10/15/2014  . Calculus of kidney 10/15/2014  . Benign prostatic hyperplasia with urinary obstruction 12/09/2011  . Hyperlipidemia 10/29/2009  . LACERATION, HAND, RIGHT 10/29/2009      Labs reviewed: Basic Metabolic Panel:    Component Value Date/Time   NA 139 03/06/2017   K 4.2 03/06/2017   CL 104 04/05/2016 0834   CO2 27 04/05/2016 0834   GLUCOSE 101 (H) 04/05/2016 0834   BUN 16 03/06/2017   CREATININE 0.7 03/06/2017   CREATININE 0.81 04/05/2016 0834   CALCIUM 9.2 04/05/2016 0834   AST 51 (A) 03/06/2016   ALT 39 03/06/2016   ALKPHOS 98 03/06/2016   GFRNONAA >60 04/05/2016 0834   GFRAA >60 04/05/2016 0834     Recent Labs  04/05/16 0834  03/04/17 0707 03/05/17 0803 03/06/17  NA 136  < > 136* 138 139  K 4.4  < > 4.2 4.0 4.2  CL 104  --   --   --   --   CO2 27  --   --   --   --   GLUCOSE 101*  --   --   --   --   BUN 15  < > 18 17 16   CREATININE 0.81  < > 0.8 0.7 0.7  CALCIUM 9.2  --   --   --   --   < > = values in this interval not displayed. Liver Function Tests: No results for input(s): AST, ALT, ALKPHOS, BILITOT, PROT, ALBUMIN in the last 8760 hours. No results for input(s): LIPASE, AMYLASE in the last 8760 hours. No results for input(s): AMMONIA in the last 8760 hours. CBC:  Recent Labs  04/05/16 0834  03/04/17 0707 03/05/17 0803 03/06/17  WBC 7.8  < > 12.5 7.8 8.0  NEUTROABS 5.8  --   --   --   --   HGB 11.1*  < > 12.3* 11.3* 11.4*  HCT 33.9*  < > 34* 32* 31*  MCV 90.6  --   --   --   --  PLT 414*  < > 226 202 215  < > = values in this interval not displayed. Lipid No results for input(s): CHOL, HDL, LDLCALC, TRIG in the last 8760 hours.  Cardiac Enzymes: No results for input(s): CKTOTAL, CKMB, CKMBINDEX, TROPONINI in the last 8760 hours. BNP: No  results for input(s): BNP in the last 8760 hours. No results found for: MICROALBUR No results found for: HGBA1C No results found for: TSH No results found for: VITAMINB12 No results found for: FOLATE No results found for: IRON, TIBC, FERRITIN  Imaging and Procedures obtained prior to SNF admission: No results found.   Not all labs, radiology exams or other studies done during hospitalization come through on my EPIC note; however they are reviewed by me.    Assessment and Plan  FALL/SAH-CTA showed a small volume subarachnoid hemorrhage overlying the right frontal lobe and within the left sylvian fissure. There was also possible tiny subdural hemorrhage along the left tentorial leaflet. There was also a small focus of hemorrhage at the gray-white junction of the right frontal lobe. Patient had no focal findings. Patient remained stable during hospitalization and was subsequently able to be DC SNF -patient is admitted to skilled nursing facility with generalized weakness status post SAH for OT/PT  LEFT CLAVICULAR FRACTURE-pressure the medial part of the left clavicle; poor. The patient was placed in a sling and will be weightbearing as possible as tolerated left arm SNF -patient is admitted for OT/PT for left clavicle fracture  CHRONIC STENT AAA WITH INFECTION SNF- patient will continue Duricef 500 mg 1 by mouth twice a day forever; also will restart ASA 81 mg and Plavix 75 mg daily both on June 23  HYPERLIPIDEMIA SNF -not stated as uncontrolled continue Lipitor 40 mg daily  DEMENTIA SNF _ stable continue Aricept 5 mg daily at bedtime  BPH SNF -stable continue Proscar 5 mg daily  INSOMNIA SNF -continue melatonin 3 mg by mouth daily at bedtime   Time spent . 45 min;> 50% of time with patient was spent reviewing records, labs, tests and studies, counseling and developing plan of care  Thurston Holenne D. Lyn HollingsheadAlexander, MD

## 2017-03-13 ENCOUNTER — Encounter: Payer: Self-pay | Admitting: Internal Medicine

## 2017-03-13 ENCOUNTER — Non-Acute Institutional Stay (SKILLED_NURSING_FACILITY): Payer: Medicare Other | Admitting: Internal Medicine

## 2017-03-13 DIAGNOSIS — H532 Diplopia: Secondary | ICD-10-CM

## 2017-03-13 NOTE — Progress Notes (Signed)
Location:  [facility] Room Number: [phone] Place of Service:  SNF [phone])  Provider: Randon Goldsmith. Lyn Hollingshead, MD  Patient Care Team: Teodoro Spray, MD as PCP - General (Internal Medicine)  Extended Emergency Contact Information Primary Emergency Contact: [facility] Address: [address]          [place], [place] [address] D[place] of [place] Home Phone: [phone] Relation: Spouse    Allergies: Oxybutynin  Chief Complaint  Patient presents with  . Acute Visit    HPI: Patient is 81 y.o. male who Who is being seen acutely because nursing has said that patient has complained of double vision. I ask speech therapy if they had noted anything with the patient and she replied that his perception does seem to be off. Patient did not not make any complaints to me when I examined him.  Past Medical History:  Diagnosis Date  . AAA (abdominal aortic aneurysm) (HCC)   . Aortic aneurysm (HCC)    2012  . Bacteremia due to Staphylococcus aureus   . Benign prostatic hypertrophy with lower urinary tract symptoms (LUTS)   . Calculus of kidney   . Calculus of ureter   . COPD exacerbation (HCC)   . Depression 06/18/2012   [facility] Wort for this  . Elevated prostate specific antigen (PSA)   . H/O endovascular stent graft for abdominal aortic aneurysm   . Heart murmur 06/18/2012   Echo 05/22/12 norm, no e/o valvular disease/dysfunction  . History of fusion of cervical spine 06/18/2012   2008 C4-7  . History of urinary retention   . Hyperlipidemia 06/18/2012  . Liver cyst   . Olecranon bursitis of right elbow   . Pyelonephritis   . Renal calculi   . Renal cyst    acquired  . Sepsis due to methicillin susceptible Staphylococcus aureus (HCC)   . Septic olecranon bursitis of right elbow   . Staphylococcus aureus bacteremia 02/2016   thought to infect aortic graft  . Urinary frequency     Past Surgical History:  Procedure Laterality Date  . ABDOMINAL  AORTIC ANEURYSM REPAIR    . APPENDECTOMY    . CERVICAL DISC ARTHROPLASTY  2008  . KIDNEY STONE SURGERY  2010  . LITHOTRIPSY    . REVISION TOTAL SHOULDER ARTHROPLASTY Right 01/16/16    Allergies as of 03/13/2017      Reactions   Oxybutynin Anaphylaxis   Causes urinary retention      Medication List       Accurate as of 03/13/17  2:39 PM. Always use your most recent med list.          acetaminophen 325 MG tablet Commonly known as:  TYLENOL Take 650 mg by mouth every 6 (six) hours as needed.   albuterol 108 (90 Base) MCG/ACT inhaler Commonly known as:  PROVENTIL HFA;VENTOLIN HFA Inhale 1 puff into the lungs every 6 (six) hours as needed. For COPD   alfuzosin 10 MG 24 hr tablet Commonly known as:  UROXATRAL Take 10 mg by mouth at bedtime.   aspirin EC 81 MG tablet Take 81 mg by mouth daily.   atorvastatin 40 MG tablet Commonly known as:  LIPITOR Take 40 mg by mouth daily.   cefadroxil 500 MG capsule Commonly known as:  DURICEF Take 500 mg by mouth 2 (two) times daily.   clopidogrel 75 MG tablet Commonly known as:  PLAVIX Take 75 mg by mouth daily.   donepezil 5 MG tablet Commonly  known as:  ARICEPT Take 5 mg by mouth at bedtime.   finasteride 5 MG tablet Commonly known as:  PROSCAR Take 5 mg by mouth daily.   Melatonin 3 MG Tabs Take 1 tablet by mouth at bedtime.   oxyCODONE 5 MG immediate release tablet Commonly known as:  Oxy IR/ROXICODONE Take 2.5 mg by mouth every 6 (six) hours as needed for severe pain. 1/2 tablet   Potassium 99 MG Tabs Take 1 tablet by mouth daily.   sennosides-docusate sodium 8.6-50 MG tablet Commonly known as:  SENOKOT-S Take 2 tablets by mouth at bedtime.       No orders of the defined types were placed in this encounter.   Immunization History  Administered Date(s) Administered  . PPD Test 03/16/2016, 04/01/2016  . Pneumococcal Polysaccharide-23 07/19/2016  . Tdap 06/28/2014    Social History  Substance Use  Topics  . Smoking status: Former Smoker    Packs/day: 1.00    Years: 40.00    Types: Cigarettes    Quit date: 12/08/1996  . Smokeless tobacco: Never Used  . Alcohol use 0.0 oz/week     Comment: 1-3 glasses of wine weekly    Review of Systems  DATA OBTAINED: from patient, nurse, ST GENERAL:  no fevers, fatigue, appetite changes SKIN: No itching, rash HEENT: Double vision RESPIRATORY: No cough, wheezing, SOB CARDIAC: No chest pain, palpitations, lower extremity edema  GI: No abdominal pain, No N/V/D or constipation, No heartburn or reflux  GU: No dysuria, frequency or urgency, or incontinence  MUSCULOSKELETAL: No unrelieved bone/joint pain NEUROLOGIC: No headache, dizziness  PSYCHIATRIC: No overt anxiety or sadness  Vitals:   03/13/17 1431  BP: 139/71  Pulse: 62  Resp: 18  Temp: 97.1 F (36.2 C)   Body mass index is 23.96 kg/m. Physical Exam  GENERAL APPEARANCE: Alert, mod conversant, No acute distress  SKIN: No diaphoresis rash HEENT: Pupils are equal and reactive; there are no signs of infection; patient's right eye intermittently roams medially RESPIRATORY: Breathing is even, unlabored. Lung sounds are clear   CARDIOVASCULAR: Heart RRR no murmurs, rubs or gallops. No peripheral edema  GASTROINTESTINAL: Abdomen is soft, non-tender, not distended w/ normal bowel sounds.  GENITOURINARY: Bladder non tender, not distended  MUSCULOSKELETAL: No abnormal joints or musculature NEUROLOGIC: Cranial nerves 2-12 grossly intact with the exception that patient's right eye intermittently moves inward/medial. Moves all extremities PSYCHIATRIC: Mood and affect with dementia, no behavioral issues  Patient Active Problem List   Diagnosis Date Noted  . Right rib fracture 03/27/2016  . MSSA (methicillin susceptible Staphylococcus aureus) septicemia (HCC) 03/26/2016  . MSSA (methicillin susceptible Staphylococcus aureus) infection 03/26/2016  . Dementia without behavioral disturbance  03/26/2016  . Restrictive lung disease 03/26/2016  . Frequent falls 01/15/2016  . Abnormality of gait 01/15/2016  . Septic olecranon bursitis of right elbow 01/11/2016  . CAD in native artery 10/15/2014  . Calculus of kidney 10/15/2014  . Benign prostatic hyperplasia with urinary obstruction 12/09/2011  . Hyperlipidemia 10/29/2009  . LACERATION, HAND, RIGHT 10/29/2009    CMP     Component Value Date/Time   NA 139 03/06/2017   K 4.2 03/06/2017   CL 104 04/05/2016 0834   CO2 27 04/05/2016 0834   GLUCOSE 101 (H) 04/05/2016 0834   BUN 16 03/06/2017   CREATININE 0.7 03/06/2017   CREATININE 0.81 04/05/2016 0834   CALCIUM 9.2 04/05/2016 0834   AST 51 (A) 03/06/2016   ALT 39 03/06/2016   ALKPHOS 98 03/06/2016  GFRNONAA >60 04/05/2016 0834   GFRAA >60 04/05/2016 0834    Recent Labs  04/05/16 0834  03/04/17 0707 03/05/17 0803 03/06/17  NA 136  < > 136* 138 139  K 4.4  < > 4.2 4.0 4.2  CL 104  --   --   --   --   CO2 27  --   --   --   --   GLUCOSE 101*  --   --   --   --   BUN 15  < > 18 17 16   CREATININE 0.81  < > 0.8 0.7 0.7  CALCIUM 9.2  --   --   --   --   < > = values in this interval not displayed. No results for input(s): AST, ALT, ALKPHOS, BILITOT, PROT, ALBUMIN in the last 8760 hours.  Recent Labs  04/05/16 0834  03/04/17 0707 03/05/17 0803 03/06/17  WBC 7.8  < > 12.5 7.8 8.0  NEUTROABS 5.8  --   --   --   --   HGB 11.1*  < > 12.3* 11.3* 11.4*  HCT 33.9*  < > 34* 32* 31*  MCV 90.6  --   --   --   --   PLT 414*  < > 226 202 215  < > = values in this interval not displayed. No results for input(s): CHOL, LDLCALC, TRIG in the last 8760 hours.  Invalid input(s): HCL No results found for: MICROALBUR No results found for: TSH No results found for: HGBA1C No results found for: CHOL, HDL, LDLCALC, LDLDIRECT, TRIG, CHOLHDL  Significant Diagnostic Results in last 30 days:  No results found.  Assessment and Plan   DIPLOPIA -pt's noted R eye inward movement  can certainly make patient have double vision; have written order for patient to see ophthalmology as outpatient     Randon Goldsmith. Lyn Hollingshead,   MD

## 2017-03-18 ENCOUNTER — Encounter: Payer: Self-pay | Admitting: Internal Medicine

## 2017-03-18 DIAGNOSIS — Z95828 Presence of other vascular implants and grafts: Secondary | ICD-10-CM | POA: Insufficient documentation

## 2017-03-18 DIAGNOSIS — S42023A Displaced fracture of shaft of unspecified clavicle, initial encounter for closed fracture: Secondary | ICD-10-CM | POA: Insufficient documentation

## 2017-03-18 DIAGNOSIS — G4700 Insomnia, unspecified: Secondary | ICD-10-CM

## 2017-03-18 DIAGNOSIS — Z8679 Personal history of other diseases of the circulatory system: Secondary | ICD-10-CM | POA: Insufficient documentation

## 2017-03-18 DIAGNOSIS — I714 Abdominal aortic aneurysm, without rupture, unspecified: Secondary | ICD-10-CM | POA: Insufficient documentation

## 2017-03-18 DIAGNOSIS — I609 Nontraumatic subarachnoid hemorrhage, unspecified: Secondary | ICD-10-CM | POA: Insufficient documentation

## 2017-03-18 HISTORY — DX: Insomnia, unspecified: G47.00

## 2017-03-18 HISTORY — DX: Displaced fracture of shaft of unspecified clavicle, initial encounter for closed fracture: S42.023A

## 2017-03-20 DIAGNOSIS — H532 Diplopia: Secondary | ICD-10-CM | POA: Diagnosis not present

## 2017-03-26 ENCOUNTER — Encounter: Payer: Self-pay | Admitting: Internal Medicine

## 2017-03-27 DIAGNOSIS — R972 Elevated prostate specific antigen [PSA]: Secondary | ICD-10-CM | POA: Diagnosis not present

## 2017-03-28 ENCOUNTER — Encounter: Payer: Self-pay | Admitting: Internal Medicine

## 2017-03-28 ENCOUNTER — Non-Acute Institutional Stay (SKILLED_NURSING_FACILITY): Payer: Medicare Other | Admitting: Internal Medicine

## 2017-03-28 DIAGNOSIS — G301 Alzheimer's disease with late onset: Secondary | ICD-10-CM | POA: Diagnosis not present

## 2017-03-28 DIAGNOSIS — N401 Enlarged prostate with lower urinary tract symptoms: Secondary | ICD-10-CM

## 2017-03-28 DIAGNOSIS — I714 Abdominal aortic aneurysm, without rupture, unspecified: Secondary | ICD-10-CM

## 2017-03-28 DIAGNOSIS — F5101 Primary insomnia: Secondary | ICD-10-CM

## 2017-03-28 DIAGNOSIS — E785 Hyperlipidemia, unspecified: Secondary | ICD-10-CM

## 2017-03-28 DIAGNOSIS — R296 Repeated falls: Secondary | ICD-10-CM | POA: Diagnosis not present

## 2017-03-28 DIAGNOSIS — I609 Nontraumatic subarachnoid hemorrhage, unspecified: Secondary | ICD-10-CM

## 2017-03-28 DIAGNOSIS — A4901 Methicillin susceptible Staphylococcus aureus infection, unspecified site: Secondary | ICD-10-CM | POA: Diagnosis not present

## 2017-03-28 DIAGNOSIS — Z95828 Presence of other vascular implants and grafts: Secondary | ICD-10-CM

## 2017-03-28 DIAGNOSIS — S42022D Displaced fracture of shaft of left clavicle, subsequent encounter for fracture with routine healing: Secondary | ICD-10-CM

## 2017-03-28 DIAGNOSIS — F0280 Dementia in other diseases classified elsewhere without behavioral disturbance: Secondary | ICD-10-CM

## 2017-03-28 DIAGNOSIS — N138 Other obstructive and reflux uropathy: Secondary | ICD-10-CM | POA: Diagnosis not present

## 2017-03-28 NOTE — Progress Notes (Signed)
Location:  [facility] Room Number: [phone] Place of Service:  SNF [phone])  Provider: Randon Goldsmith. Lyn Hollingshead, MD  PCP: Teodoro Spray, MD Patient Care Team: Teodoro Spray, MD as PCP - General (Internal Medicine)  Extended Emergency Contact Information Primary Emergency Contact: [facility] Address: [address]          [place], [place] [address] D[place] of [place] Home Phone: [phone] Relation: Spouse  Allergies  Allergen Reactions  . Oxybutynin Anaphylaxis    Causes urinary retention    Chief Complaint  Patient presents with  . Discharge Note    discharge from SNF to home    HPI:  81 y.o. male  with BPH, HLD, dementia, CAD on aspirin and Plavix, history of infected abdominal aortic aneurysm stented on chronic antibiotic D therapy who presented to [facility] after a fall. Patient was admitted to [facility] from 6/15-20 where he was found to have a small subarachnoid hemorrhage and a left-sided clavicular fracture. Patient was seen by neurology who recommended every 4 hour neuro checks and holding aspirin and Plavix. Patient has subsequent imaging which did not show any progression and patient had no worsening of neurologic symptoms. It was plan that patient should start back on aspirin and Plavix in one week which would be June 23 orthopedics saw the clavicular fracture recommended sling and weightbearing as tolerated along with physical therapy. Patient was admitted to skilled nursing facility for physical therapy for his subarachnoid hemorrhage and his clavicular fracture.Pt is now ready to be d/c to home.    Past Medical History:  Diagnosis Date  . AAA (abdominal aortic aneurysm) (HCC)   . Abnormality of gait 01/15/2016  . Aortic aneurysm (HCC)    2012  . Bacteremia due to Staphylococcus aureus   . Benign prostatic hyperplasia with urinary obstruction 12/09/2011  . Benign prostatic hypertrophy with lower urinary tract  symptoms (LUTS)   . CAD in native artery 10/15/2014  . Calculus of kidney   . Calculus of ureter   . Clavicle fracture, shaft 03/18/2017  . COPD exacerbation (HCC)   . Dementia without behavioral disturbance 03/26/2016  . Depression 06/18/2012   [facility] Wort for this  . Elevated prostate specific antigen (PSA)   . H/O endovascular stent graft for abdominal aortic aneurysm   . Heart murmur 06/18/2012   Echo 05/22/12 norm, no e/o valvular disease/dysfunction  . History of fusion of cervical spine 06/18/2012   2008 C4-7  . History of urinary retention   . Hyperlipidemia 06/18/2012  . Insomnia 03/18/2017  . Liver cyst   . MSSA (methicillin susceptible Staphylococcus aureus) septicemia (HCC) 03/26/2016  . Olecranon bursitis of right elbow   . Pyelonephritis   . Renal calculi   . Renal cyst    acquired  . Restrictive lung disease 03/26/2016  . Right rib fracture 03/27/2016  . Sepsis due to methicillin susceptible Staphylococcus aureus (HCC)   . Septic olecranon bursitis of right elbow   . Staphylococcus aureus bacteremia 02/2016   thought to infect aortic graft  . Urinary frequency     Past Surgical History:  Procedure Laterality Date  . ABDOMINAL AORTIC ANEURYSM REPAIR    . APPENDECTOMY    . CERVICAL DISC ARTHROPLASTY  2008  . KIDNEY STONE SURGERY  2010  . LITHOTRIPSY    . REVISION TOTAL SHOULDER ARTHROPLASTY Right 01/16/16     reports that he quit smoking about 20 years ago. His smoking use included  Cigarettes. He has a 40.00 pack-year smoking history. He has never used smokeless tobacco. He reports that he drinks alcohol. He reports that he does not use drugs. Social History   Social History  . Marital status: Married    Spouse name: N/A  . Number of children: 1  . Years of education: N/A   Occupational History  . retired Dietitianprogram manager    Social History Main Topics  . Smoking status: Former Smoker    Packs/day: 1.00    Years: 40.00    Types: Cigarettes    Quit  date: 12/08/1996  . Smokeless tobacco: Never Used  . Alcohol use 0.0 oz/week     Comment: 1-3 glasses of wine weekly  . Drug use: No  . Sexual activity: Not on file   Other Topics Concern  . Not on file   Social History Narrative   Admitted to [facility]/08/17   Married   Former smoker -stopped 1998   Alcohol wine weekly    DNR    Pertinent  Health Maintenance Due  Topic Date Due  . INFLUENZA VACCINE  04/19/2017  . PNA vac Low Risk Adult (2 of 2 - PCV13) 07/19/2017    Medications: Allergies as of 03/28/2017      Reactions   Oxybutynin Anaphylaxis   Causes urinary retention      Medication List       Accurate as of 03/28/17  8:46 PM. Always use your most recent med list.          acetaminophen 325 MG tablet Commonly known as:  TYLENOL Take 650 mg by mouth every 6 (six) hours as needed.   albuterol 108 (90 Base) MCG/ACT inhaler Commonly known as:  PROVENTIL HFA;VENTOLIN HFA Inhale 1 puff into the lungs every 6 (six) hours as needed. For COPD   alfuzosin 10 MG 24 hr tablet Commonly known as:  UROXATRAL Take 10 mg by mouth at bedtime.   aspirin EC 81 MG tablet Take 81 mg by mouth daily.   atorvastatin 40 MG tablet Commonly known as:  LIPITOR Take 40 mg by mouth daily.   cefadroxil 500 MG capsule Commonly known as:  DURICEF Take 500 mg by mouth 2 (two) times daily.   clopidogrel 75 MG tablet Commonly known as:  PLAVIX Take 75 mg by mouth daily.   donepezil 5 MG tablet Commonly known as:  ARICEPT Take 5 mg by mouth at bedtime.   finasteride 5 MG tablet Commonly known as:  PROSCAR Take 5 mg by mouth daily.   Melatonin 3 MG Tabs Take 1 tablet by mouth at bedtime.   oxyCODONE 5 MG immediate release tablet Commonly known as:  Oxy IR/ROXICODONE Take by mouth. Take 1/2 tablet every 6 hours for pain   Potassium 99 MG Tabs Take 1 tablet by mouth daily.   sennosides-docusate sodium 8.6-50 MG tablet Commonly known as:  SENOKOT-S Take 2 tablets by  mouth at bedtime.        Vitals:   03/28/17 1117  BP: 139/71  Pulse: 62  Resp: 16  Temp: (!) 97.1 F (36.2 C)  Weight: 150 lb 3.2 oz (68.1 kg)  Height: 5\' 5"  (1.651 m)   Body mass index is 24.99 kg/m.  Physical Exam  GENERAL APPEARANCE: Alert, conversant. No acute distress.  HEENT: Unremarkable. RESPIRATORY: Breathing is even, unlabored. Lung sounds are clear   CARDIOVASCULAR: Heart RRR no murmurs, rubs or gallops. No peripheral edema.  GASTROINTESTINAL: Abdomen is soft, non-tender, not distended  w/ normal bowel sounds.  NEUROLOGIC: Cranial nerves 2-12 grossly intact. Moves all extremities   Labs reviewed: Basic Metabolic Panel:  Recent Labs  16/10/96 0834  03/04/17 0707 03/05/17 0803 03/06/17  NA 136  < > 136* 138 139  K 4.4  < > 4.2 4.0 4.2  CL 104  --   --   --   --   CO2 27  --   --   --   --   GLUCOSE 101*  --   --   --   --   BUN 15  < > 18 17 16   CREATININE 0.81  < > 0.8 0.7 0.7  CALCIUM 9.2  --   --   --   --   < > = values in this interval not displayed. No results found for: [facility] Liver Function Tests: No results for input(s): AST, ALT, ALKPHOS, BILITOT, PROT, ALBUMIN in the last 8760 hours. No results for input(s): LIPASE, AMYLASE in the last 8760 hours. No results for input(s): AMMONIA in the last 8760 hours. CBC:  Recent Labs  04/05/16 0834  03/04/17 0707 03/05/17 0803 03/06/17  WBC 7.8  < > 12.5 7.8 8.0  NEUTROABS 5.8  --   --   --   --   HGB 11.1*  < > 12.3* 11.3* 11.4*  HCT 33.9*  < > 34* 32* 31*  MCV 90.6  --   --   --   --   PLT 414*  < > 226 202 215  < > = values in this interval not displayed. Lipid No results for input(s): CHOL, HDL, LDLCALC, TRIG in the last 8760 hours. Cardiac Enzymes: No results for input(s): CKTOTAL, CKMB, CKMBINDEX, TROPONINI in the last 8760 hours. BNP: No results for input(s): BNP in the last 8760 hours. CBG: No results for input(s): GLUCAP in the last 8760 hours.  Procedures and Imaging  Studies During Stay: No results found.  Assessment/Plan:   SAH (subarachnoid hemorrhage) (HCC)  Frequent falls  Closed displaced fracture of shaft of left clavicle with routine healing, subsequent encounter  Abdominal aortic aneurysm (AAA) without rupture (HCC)  H/O endovascular stent graft for abdominal aortic aneurysm  Hyperlipidemia, unspecified hyperlipidemia type  Primary insomnia  Benign prostatic hyperplasia with urinary obstruction  Late onset Alzheimer's disease without behavioral disturbance  MSSA (methicillin susceptible Staphylococcus aureus) infection   Patient is being discharged with the following home health services:  OT/PT/Nursing  Patient is being discharged with the following durable medical equipment:  none  Patient has been advised to f/u with their PCP in 1-2 weeks to bring them up to date on their rehab stay.  Social services at facility was responsible for arranging this appointment.  Pt was provided with a 30 day supply of prescriptions for medications and refills must be obtained from their PCP.  For controlled substances, a more limited supply may be provided adequate until PCP appointment only.  Meds-reconciled.  Time spent > 30 min;> 50% of time with patient was spent reviewing records, labs, tests and studies, counseling and developing plan of care  Randon Goldsmith. Lyn Hollingshead, MD

## 2017-03-29 DIAGNOSIS — S42011D Anterior displaced fracture of sternal end of right clavicle, subsequent encounter for fracture with routine healing: Secondary | ICD-10-CM | POA: Diagnosis not present

## 2017-03-29 DIAGNOSIS — S42022D Displaced fracture of shaft of left clavicle, subsequent encounter for fracture with routine healing: Secondary | ICD-10-CM | POA: Diagnosis not present

## 2017-03-29 DIAGNOSIS — F0390 Unspecified dementia without behavioral disturbance: Secondary | ICD-10-CM | POA: Diagnosis not present

## 2017-03-31 ENCOUNTER — Encounter: Payer: Self-pay | Admitting: Internal Medicine

## 2017-03-31 NOTE — Progress Notes (Signed)
[facility] Room Number: 103 Place of Service:  SNF [phone]  Provider: Randon GoldsmithAnne D. Lyn HollingsheadAlexander, MD  PCP: Teodoro Sprayrujillo, Jamie, MD Patient Care Team: Teodoro Sprayrujillo, Jamie, MD as PCP - General (Internal Medicine)  Extended Emergency Contact Information Primary Emergency Contact: [facility] Address: [address]          [place], [place] [address] [place] Home Phone: [phone] Relation: Spouse  Allergies  Allergen Reactions  . Oxybutynin Anaphylaxis    Causes urinary retention    Chief Complaint  Patient presents with  . Discharge Note    discharge from SNF to home    HPI:  81 y.o. male      Past Medical History:  Diagnosis Date  . AAA (abdominal aortic aneurysm) (HCC)   . Abnormality of gait 01/15/2016  . Aortic aneurysm (HCC)    2012  . Bacteremia due to Staphylococcus aureus   . Benign prostatic hyperplasia with urinary obstruction 12/09/2011  . Benign prostatic hypertrophy with lower urinary tract symptoms (LUTS)   . CAD in native artery 10/15/2014  . Calculus of kidney   . Calculus of ureter   . Clavicle fracture, shaft 03/18/2017  . COPD exacerbation (HCC)   . Dementia without behavioral disturbance 03/26/2016  . Depression 06/18/2012   [facility] Wort for this  . Elevated prostate specific antigen (PSA)   . H/O endovascular stent graft for abdominal aortic aneurysm   . Heart murmur 06/18/2012   Echo 05/22/12 norm, no e/o valvular disease/dysfunction  . History of fusion of cervical spine 06/18/2012   2008 C4-7  . History of urinary retention   . Hyperlipidemia 06/18/2012  . Insomnia 03/18/2017  . Liver cyst   . MSSA (methicillin susceptible Staphylococcus aureus) septicemia (HCC) 03/26/2016  . Olecranon bursitis of right elbow   . Pyelonephritis   . Renal calculi   . Renal cyst    acquired  . Restrictive lung disease 03/26/2016  . Right rib fracture 03/27/2016  . Sepsis due to methicillin susceptible  Staphylococcus aureus (HCC)   . Septic olecranon bursitis of right elbow   . Staphylococcus aureus bacteremia 02/2016   thought to infect aortic graft  . Urinary frequency     Past Surgical History:  Procedure Laterality Date  . ABDOMINAL AORTIC ANEURYSM REPAIR    . APPENDECTOMY    . CERVICAL DISC ARTHROPLASTY  2008  . KIDNEY STONE SURGERY  2010  . LITHOTRIPSY    . REVISION TOTAL SHOULDER ARTHROPLASTY Right 01/16/16     reports that he quit smoking about 20 years ago. His smoking use included Cigarettes. He has a 40.00 pack-year smoking history. He has never used smokeless tobacco. He reports that he drinks alcohol. He reports that he does not use drugs. Social History   Social History  . Marital status: Married    Spouse name: N/A  . Number of children: 1  . Years of education: N/A   Occupational History  . retired Dietitianprogram manager    Social History Main Topics  . Smoking status: Former Smoker    Packs/day: 1.00    Years: 40.00    Types: Cigarettes    Quit date: 12/08/1996  . Smokeless tobacco: Never Used  . Alcohol use 0.0 oz/week     Comment: 1-3 glasses of wine weekly  . Drug use: No  . Sexual activity: Not on file   Other Topics Concern  . Not on file   Social History Narrative  Admitted to [facility] 03/08/17   Married   Former smoker -stopped 1998   Alcohol wine weekly    DNR    Pertinent  Health Maintenance Due  Topic Date Due  . INFLUENZA VACCINE  04/19/2017  . PNA vac Low Risk Adult (2 of 2 - PCV13) 07/19/2017    Medications: Allergies as of 03/31/2017      Reactions   Oxybutynin Anaphylaxis   Causes urinary retention      Medication List       Accurate as of 03/31/17  1:58 PM. Always use your most recent med list.          acetaminophen 325 MG tablet Commonly known as:  TYLENOL Take 650 mg by mouth every 6 (six) hours as needed.   albuterol 108 (90 Base) MCG/ACT inhaler Commonly known as:  PROVENTIL HFA;VENTOLIN HFA Inhale 1 puff  into the lungs every 6 (six) hours as needed. For COPD   alfuzosin 10 MG 24 hr tablet Commonly known as:  UROXATRAL Take 10 mg by mouth at bedtime.   aspirin EC 81 MG tablet Take 81 mg by mouth daily.   atorvastatin 40 MG tablet Commonly known as:  LIPITOR Take 40 mg by mouth daily.   cefadroxil 500 MG capsule Commonly known as:  DURICEF Take 500 mg by mouth 2 (two) times daily.   clopidogrel 75 MG tablet Commonly known as:  PLAVIX Take 75 mg by mouth daily.   donepezil 5 MG tablet Commonly known as:  ARICEPT Take 5 mg by mouth at bedtime.   finasteride 5 MG tablet Commonly known as:  PROSCAR Take 5 mg by mouth daily.   Melatonin 3 MG Tabs Take 1 tablet by mouth at bedtime.   oxyCODONE 5 MG immediate release tablet Commonly known as:  Oxy IR/ROXICODONE Take by mouth. Take 1/2 tablet every 6 hours for pain   Potassium 99 MG Tabs Take 1 tablet by mouth daily.   sennosides-docusate sodium 8.6-50 MG tablet Commonly known as:  SENOKOT-S Take 2 tablets by mouth at bedtime.        Vitals:   03/31/17 1354  BP: 135/67  Pulse: 76  Resp: 18  Temp: (!) 97.2 F (36.2 C)  SpO2: 97%  Weight: 150 lb (68 kg)  Height: 5\' 5"  (1.651 m)   Body mass index is 24.96 kg/m.  Physical Exam  GENERAL APPEARANCE: Alert, conversant. No acute distress.  HEENT: Unremarkable. RESPIRATORY: Breathing is even, unlabored. Lung sounds are clear   CARDIOVASCULAR: Heart RRR no murmurs, rubs or gallops. No peripheral edema.  GASTROINTESTINAL: Abdomen is soft, non-tender, not distended w/ normal bowel sounds.  NEUROLOGIC: Cranial nerves 2-12 grossly intact. Moves all extremities   Labs reviewed: Basic Metabolic Panel:  Recent Labs  60/45/40 0834  03/04/17 0707 03/05/17 0803 03/06/17  NA 136  < > 136* 138 139  K 4.4  < > 4.2 4.0 4.2  CL 104  --   --   --   --   CO2 27  --   --   --   --   GLUCOSE 101*  --   --   --   --   BUN 15  < > 18 17 16   CREATININE 0.81  < > 0.8 0.7 0.7   CALCIUM 9.2  --   --   --   --   < > = values in this interval not displayed. No results found for: [facility] Liver Function Tests: No results for  input(s): AST, ALT, ALKPHOS, BILITOT, PROT, ALBUMIN in the last 8760 hours. No results for input(s): LIPASE, AMYLASE in the last 8760 hours. No results for input(s): AMMONIA in the last 8760 hours. CBC:  Recent Labs  04/05/16 0834  03/04/17 0707 03/05/17 0803 03/06/17  WBC 7.8  < > 12.5 7.8 8.0  NEUTROABS 5.8  --   --   --   --   HGB 11.1*  < > 12.3* 11.3* 11.4*  HCT 33.9*  < > 34* 32* 31*  MCV 90.6  --   --   --   --   PLT 414*  < > 226 202 215  < > = values in this interval not displayed. Lipid No results for input(s): CHOL, HDL, LDLCALC, TRIG in the last 8760 hours. Cardiac Enzymes: No results for input(s): CKTOTAL, CKMB, CKMBINDEX, TROPONINI in the last 8760 hours. BNP: No results for input(s): BNP in the last 8760 hours. CBG: No results for input(s): GLUCAP in the last 8760 hours.  Procedures and Imaging Studies During Stay: No results found.  Assessment/Plan:   No diagnosis found.   Patient is being discharged with the following home health services:    Patient is being discharged with the following durable medical equipment:    Patient has been advised to f/u with their PCP in 1-2 weeks to bring them up to date on their rehab stay.  Social services at facility was responsible for arranging this appointment.  Pt was provided with a 30 day supply of prescriptions for medications and refills must be obtained from their PCP.  For controlled substances, a more limited supply may be provided adequate until PCP appointment only.  Future labs/tests needed:     Randon Goldsmith. Lyn Hollingshead, MD  This encounter was created in error - please disregard.

## 2017-04-03 DIAGNOSIS — J449 Chronic obstructive pulmonary disease, unspecified: Secondary | ICD-10-CM | POA: Diagnosis not present

## 2017-04-03 DIAGNOSIS — Z87891 Personal history of nicotine dependence: Secondary | ICD-10-CM | POA: Diagnosis not present

## 2017-04-03 DIAGNOSIS — Z96611 Presence of right artificial shoulder joint: Secondary | ICD-10-CM | POA: Diagnosis not present

## 2017-04-03 DIAGNOSIS — Z9181 History of falling: Secondary | ICD-10-CM | POA: Diagnosis not present

## 2017-04-03 DIAGNOSIS — R339 Retention of urine, unspecified: Secondary | ICD-10-CM | POA: Diagnosis not present

## 2017-04-03 DIAGNOSIS — S42012D Anterior displaced fracture of sternal end of left clavicle, subsequent encounter for fracture with routine healing: Secondary | ICD-10-CM | POA: Diagnosis not present

## 2017-04-03 DIAGNOSIS — N401 Enlarged prostate with lower urinary tract symptoms: Secondary | ICD-10-CM | POA: Diagnosis not present

## 2017-04-03 DIAGNOSIS — F0390 Unspecified dementia without behavioral disturbance: Secondary | ICD-10-CM | POA: Diagnosis not present

## 2017-04-03 DIAGNOSIS — I2510 Atherosclerotic heart disease of native coronary artery without angina pectoris: Secondary | ICD-10-CM | POA: Diagnosis not present

## 2017-04-03 DIAGNOSIS — Z7902 Long term (current) use of antithrombotics/antiplatelets: Secondary | ICD-10-CM | POA: Diagnosis not present

## 2017-04-03 DIAGNOSIS — F329 Major depressive disorder, single episode, unspecified: Secondary | ICD-10-CM | POA: Diagnosis not present

## 2017-04-03 DIAGNOSIS — R262 Difficulty in walking, not elsewhere classified: Secondary | ICD-10-CM | POA: Diagnosis not present

## 2017-04-03 DIAGNOSIS — E785 Hyperlipidemia, unspecified: Secondary | ICD-10-CM | POA: Diagnosis not present

## 2017-04-03 DIAGNOSIS — Z87440 Personal history of urinary (tract) infections: Secondary | ICD-10-CM | POA: Diagnosis not present

## 2017-04-03 DIAGNOSIS — R296 Repeated falls: Secondary | ICD-10-CM | POA: Diagnosis not present

## 2017-04-05 DIAGNOSIS — Z9181 History of falling: Secondary | ICD-10-CM | POA: Diagnosis not present

## 2017-04-05 DIAGNOSIS — R262 Difficulty in walking, not elsewhere classified: Secondary | ICD-10-CM | POA: Diagnosis not present

## 2017-04-05 DIAGNOSIS — F0390 Unspecified dementia without behavioral disturbance: Secondary | ICD-10-CM | POA: Diagnosis not present

## 2017-04-05 DIAGNOSIS — R296 Repeated falls: Secondary | ICD-10-CM | POA: Diagnosis not present

## 2017-04-05 DIAGNOSIS — S42012D Anterior displaced fracture of sternal end of left clavicle, subsequent encounter for fracture with routine healing: Secondary | ICD-10-CM | POA: Diagnosis not present

## 2017-04-05 DIAGNOSIS — I2510 Atherosclerotic heart disease of native coronary artery without angina pectoris: Secondary | ICD-10-CM | POA: Diagnosis not present

## 2017-04-06 DIAGNOSIS — R296 Repeated falls: Secondary | ICD-10-CM | POA: Diagnosis not present

## 2017-04-06 DIAGNOSIS — R262 Difficulty in walking, not elsewhere classified: Secondary | ICD-10-CM | POA: Diagnosis not present

## 2017-04-06 DIAGNOSIS — F0390 Unspecified dementia without behavioral disturbance: Secondary | ICD-10-CM | POA: Diagnosis not present

## 2017-04-06 DIAGNOSIS — I2510 Atherosclerotic heart disease of native coronary artery without angina pectoris: Secondary | ICD-10-CM | POA: Diagnosis not present

## 2017-04-06 DIAGNOSIS — S42012D Anterior displaced fracture of sternal end of left clavicle, subsequent encounter for fracture with routine healing: Secondary | ICD-10-CM | POA: Diagnosis not present

## 2017-04-06 DIAGNOSIS — Z9181 History of falling: Secondary | ICD-10-CM | POA: Diagnosis not present

## 2017-04-07 DIAGNOSIS — R262 Difficulty in walking, not elsewhere classified: Secondary | ICD-10-CM | POA: Diagnosis not present

## 2017-04-07 DIAGNOSIS — I2510 Atherosclerotic heart disease of native coronary artery without angina pectoris: Secondary | ICD-10-CM | POA: Diagnosis not present

## 2017-04-07 DIAGNOSIS — R296 Repeated falls: Secondary | ICD-10-CM | POA: Diagnosis not present

## 2017-04-07 DIAGNOSIS — Z9181 History of falling: Secondary | ICD-10-CM | POA: Diagnosis not present

## 2017-04-07 DIAGNOSIS — F0390 Unspecified dementia without behavioral disturbance: Secondary | ICD-10-CM | POA: Diagnosis not present

## 2017-04-07 DIAGNOSIS — S42012D Anterior displaced fracture of sternal end of left clavicle, subsequent encounter for fracture with routine healing: Secondary | ICD-10-CM | POA: Diagnosis not present

## 2017-04-09 DIAGNOSIS — I2510 Atherosclerotic heart disease of native coronary artery without angina pectoris: Secondary | ICD-10-CM | POA: Diagnosis not present

## 2017-04-09 DIAGNOSIS — R296 Repeated falls: Secondary | ICD-10-CM | POA: Diagnosis not present

## 2017-04-09 DIAGNOSIS — S42012D Anterior displaced fracture of sternal end of left clavicle, subsequent encounter for fracture with routine healing: Secondary | ICD-10-CM | POA: Diagnosis not present

## 2017-04-09 DIAGNOSIS — F0390 Unspecified dementia without behavioral disturbance: Secondary | ICD-10-CM | POA: Diagnosis not present

## 2017-04-09 DIAGNOSIS — R262 Difficulty in walking, not elsewhere classified: Secondary | ICD-10-CM | POA: Diagnosis not present

## 2017-04-09 DIAGNOSIS — Z9181 History of falling: Secondary | ICD-10-CM | POA: Diagnosis not present

## 2017-04-10 DIAGNOSIS — I2510 Atherosclerotic heart disease of native coronary artery without angina pectoris: Secondary | ICD-10-CM | POA: Diagnosis not present

## 2017-04-10 DIAGNOSIS — R262 Difficulty in walking, not elsewhere classified: Secondary | ICD-10-CM | POA: Diagnosis not present

## 2017-04-10 DIAGNOSIS — F0390 Unspecified dementia without behavioral disturbance: Secondary | ICD-10-CM | POA: Diagnosis not present

## 2017-04-10 DIAGNOSIS — S42012D Anterior displaced fracture of sternal end of left clavicle, subsequent encounter for fracture with routine healing: Secondary | ICD-10-CM | POA: Diagnosis not present

## 2017-04-10 DIAGNOSIS — Z9181 History of falling: Secondary | ICD-10-CM | POA: Diagnosis not present

## 2017-04-10 DIAGNOSIS — R296 Repeated falls: Secondary | ICD-10-CM | POA: Diagnosis not present

## 2017-04-11 ENCOUNTER — Encounter: Payer: Self-pay | Admitting: *Deleted

## 2017-04-11 ENCOUNTER — Emergency Department (INDEPENDENT_AMBULATORY_CARE_PROVIDER_SITE_OTHER)
Admission: EM | Admit: 2017-04-11 | Discharge: 2017-04-11 | Disposition: A | Discharge status: Home / Self Care | Payer: Medicare Other | Source: Home / Self Care | Attending: Family Medicine | Admitting: Family Medicine

## 2017-04-11 ENCOUNTER — Emergency Department (INDEPENDENT_AMBULATORY_CARE_PROVIDER_SITE_OTHER): Payer: Medicare Other

## 2017-04-11 DIAGNOSIS — S2242XA Multiple fractures of ribs, left side, initial encounter for closed fracture: Secondary | ICD-10-CM

## 2017-04-11 DIAGNOSIS — W109XXA Fall (on) (from) unspecified stairs and steps, initial encounter: Secondary | ICD-10-CM

## 2017-04-11 DIAGNOSIS — W108XXA Fall (on) (from) other stairs and steps, initial encounter: Secondary | ICD-10-CM

## 2017-04-11 NOTE — ED Provider Notes (Signed)
CSN: [id number]     Arrival date & time 04/11/17  1308 History   First MD Initiated Contact with Patient 04/11/17 1328     Chief Complaint  Patient presents with  . Rib Injury   (Consider location/radiation/quality/duration/timing/severity/associated sxs/prior Treatment) HPI  Michael Dillon is a 81 y.o. male presenting to UC with wife with hx of mild dementia and poor balance, c/o Left side rib pain after fall earlier today down carpeted steps at home.  Pt is on El[place]quis but wife does not believe he hit his head and is not concerned about other injuries from the fall, just Left side rib pain.  Hx of 10th rib fracture in the past.  His most recent fall a few weeks ago resulted in a fractured Left clavicle.  He has a f/u appointment with the orthopedist on July 31st.  Wife wants to make sure pt did not puncture his lung or have a serious fracture of his rib(s).   Past Medical History:  Diagnosis Date  . AAA (abdominal aortic aneurysm) (HCC)   . Abnormality of gait 01/15/2016  . Aortic aneurysm (HCC)    2012  . Bacteremia due to Staphylococcus aureus   . Benign prostatic hyperplasia with urinary obstruction 12/09/2011  . Benign prostatic hypertrophy with lower urinary tract symptoms (LUTS)   . CAD in native artery 10/15/2014  . Calculus of kidney   . Calculus of ureter   . Clavicle fracture, shaft 03/18/2017  . COPD exacerbation (HCC)   . Dementia without behavioral disturbance 03/26/2016  . Depression 06/18/2012   [facility] Wort for this  . Elevated prostate specific antigen (PSA)   . H/O endovascular stent graft for abdominal aortic aneurysm   . Heart murmur 06/18/2012   Echo 05/22/12 norm, no e/o valvular disease/dysfunction  . History of fusion of cervical spine 06/18/2012   2008 C4-7  . History of urinary retention   . Hyperlipidemia 06/18/2012  . Insomnia 03/18/2017  . Liver cyst   . MSSA (methicillin susceptible Staphylococcus aureus) septicemia (HCC) 03/26/2016  . Olecranon  bursitis of right elbow   . Pyelonephritis   . Renal calculi   . Renal cyst    acquired  . Restrictive lung disease 03/26/2016  . Right rib fracture 03/27/2016  . Sepsis due to methicillin susceptible Staphylococcus aureus (HCC)   . Septic olecranon bursitis of right elbow   . Staphylococcus aureus bacteremia 02/2016   thought to infect aortic graft  . Urinary frequency    Past Surgical History:  Procedure Laterality Date  . ABDOMINAL AORTIC ANEURYSM REPAIR    . APPENDECTOMY    . CERVICAL DISC ARTHROPLASTY  2008  . KIDNEY STONE SURGERY  2010  . LITHOTRIPSY    . REVISION TOTAL SHOULDER ARTHROPLASTY Right 01/16/16   Family History  Problem Relation Age of Onset  . Gastric cancer Father   . Stroke Father   . Aortic aneurysm Mother   . Lung disease Sister   . Hypertension Sister   . Sleep apnea Sister    Social History  Substance Use Topics  . Smoking status: Former Smoker    Packs/day: 1.00    Years: 40.00    Types: Cigarettes    Quit date: 12/08/1996  . Smokeless tobacco: Never Used  . Alcohol use 0.0 oz/week     Comment: 1-3 glasses of wine weekly    Review of Systems  Respiratory: Negative for chest tightness, shortness of breath and wheezing.   Cardiovascular: Positive for chest  pain (Left side ribs). Negative for palpitations.  Skin: Negative for color change and wound.  Neurological: Negative for dizziness, light-headedness and headaches.    Allergies  Oxybutynin  Home Medications   Prior to Admission medications   Medication Sig Start Date End Date Taking? Authorizing Provider  acetaminophen (TYLENOL) 325 MG tablet Take 650 mg by mouth every 6 (six) hours as needed.    [provider]  albuterol (PROVENTIL HFA;VENTOLIN HFA) 108 (90 Base) MCG/ACT inhaler Inhale 1 puff into the lungs every 6 (six) hours as needed. For COPD     [provider]  alfuzosin (UROXATRAL) 10 MG 24 hr tablet Take 10 mg by mouth at bedtime.     [provider]   aspirin EC 81 MG tablet Take 81 mg by mouth daily.    [provider]  atorvastatin (LIPITOR) 40 MG tablet Take 40 mg by mouth daily.    [provider]  cefadroxil (DURICEF) 500 MG capsule Take 500 mg by mouth 2 (two) times daily.    [provider]  clopidogrel (PLAVIX) 75 MG tablet Take 75 mg by mouth daily.    [provider]  donepezil (ARICEPT) 5 MG tablet Take 5 mg by mouth at bedtime.     [provider]  finasteride (PROSCAR) 5 MG tablet Take 5 mg by mouth daily.     [provider]  Melatonin 3 MG TABS Take 1 tablet by mouth at bedtime.    [provider]  oxyCODONE (OXY IR/ROXICODONE) 5 MG immediate release tablet Take by mouth. Take 1/2 tablet every 6 hours for pain    [provider]  Potassium 99 MG TABS Take 1 tablet by mouth daily.    [provider]  sennosides-docusate sodium (SENOKOT-S) 8.6-50 MG tablet Take 2 tablets by mouth at bedtime.    [provider]   Meds Ordered and Administered this Visit  Medications - No data to display  BP 124/74 (BP Location: Left Arm)   Pulse 64   Temp (!) 97.4 F (36.3 C) (Oral)   Resp 16   Ht 5\' 8"  (1.727 m)   Wt 155 lb (70.3 kg)   SpO2 98%   BMI 23.57 kg/m  No data found.   Physical Exam  Constitutional: He is oriented to person, place, and time. He appears well-developed and well-nourished. No distress.  HENT:  Head: Normocephalic and atraumatic.  Eyes: EOM are normal.  Neck: Normal range of motion. Neck supple.  No midline bone tenderness, no crepitus or step-offs.   Cardiovascular: Normal rate and regular rhythm.   Pulmonary/Chest: Effort normal and breath sounds normal. No respiratory distress. He has no wheezes. He has no rales. He exhibits tenderness ( tenderness to Left side of lower chest wall over ribs. no crepitus or deformity).  Abdominal: Soft. He exhibits no distension. There is no tenderness.  Musculoskeletal: Normal  range of motion.  Neurological: He is alert and oriented to person, place, and time.  Skin: Skin is warm and dry. He is not diaphoretic.  Psychiatric: He has a normal mood and affect. His behavior is normal.  Nursing note and vitals reviewed.   Urgent Care Course     Procedures (including critical care time)  Labs Review Labs Reviewed - No data to display  Imaging Review Dg Ribs Unilateral W/chest Left  Result Date: 04/11/2017 CLINICAL DATA:  Patient fell down a flight of stairs.  Pain. EXAM: LEFT RIBS AND CHEST - 3+ VIEW COMPARISON:  None. FINDINGS: There are minimally displaced fractures of the LEFT lateral ninth and tenth ribs. There is no evidence of pneumothorax or pleural effusion. Both lungs are clear. Heart size and mediastinal contours are within normal limits. Calcified tortuous aorta. IMPRESSION: Minimally displaced fractures of the LEFT ninth and tenth ribs. No pneumothorax. Electronically Signed   By: Elsie StainJohn T Curnes M.D.   On: 04/11/2017 14:08      MDM   1. Closed fracture of multiple ribs of left side, initial encounter   2. Fall down stairs, initial encounter    Hx and exam c/w closed minimally displaced rib fractures of ribs 9 and 10 without pneumothorax.  Pt does not appear to have any respiratory distress or other injuries from fall.  Wife states pt does have an incentive spirometer at home. Encouraged to use as ribs heal to help prevent pneumonia. F/u with PCP as needed. F/u as previously scheduled with ortho for prior clavicle fracture on July 31st Discussed symptoms that warrant emergent care in the ED.     Lurene Shadowhelps, Lyndal Reggio O, [place]-C 04/11/17 1650

## 2017-04-11 NOTE — ED Triage Notes (Signed)
Pt c/o LT rib area pain x this morning post fall at home. No OTC meds.

## 2017-04-12 DIAGNOSIS — I2510 Atherosclerotic heart disease of native coronary artery without angina pectoris: Secondary | ICD-10-CM | POA: Diagnosis not present

## 2017-04-12 DIAGNOSIS — R262 Difficulty in walking, not elsewhere classified: Secondary | ICD-10-CM | POA: Diagnosis not present

## 2017-04-12 DIAGNOSIS — S42012D Anterior displaced fracture of sternal end of left clavicle, subsequent encounter for fracture with routine healing: Secondary | ICD-10-CM | POA: Diagnosis not present

## 2017-04-12 DIAGNOSIS — Z9181 History of falling: Secondary | ICD-10-CM | POA: Diagnosis not present

## 2017-04-12 DIAGNOSIS — F0390 Unspecified dementia without behavioral disturbance: Secondary | ICD-10-CM | POA: Diagnosis not present

## 2017-04-12 DIAGNOSIS — R296 Repeated falls: Secondary | ICD-10-CM | POA: Diagnosis not present

## 2017-04-13 ENCOUNTER — Telehealth: Payer: Self-pay

## 2017-04-13 DIAGNOSIS — R262 Difficulty in walking, not elsewhere classified: Secondary | ICD-10-CM | POA: Diagnosis not present

## 2017-04-13 DIAGNOSIS — R296 Repeated falls: Secondary | ICD-10-CM | POA: Diagnosis not present

## 2017-04-13 DIAGNOSIS — S42012D Anterior displaced fracture of sternal end of left clavicle, subsequent encounter for fracture with routine healing: Secondary | ICD-10-CM | POA: Diagnosis not present

## 2017-04-13 DIAGNOSIS — F0390 Unspecified dementia without behavioral disturbance: Secondary | ICD-10-CM | POA: Diagnosis not present

## 2017-04-13 DIAGNOSIS — Z9181 History of falling: Secondary | ICD-10-CM | POA: Diagnosis not present

## 2017-04-13 DIAGNOSIS — I2510 Atherosclerotic heart disease of native coronary artery without angina pectoris: Secondary | ICD-10-CM | POA: Diagnosis not present

## 2017-04-13 NOTE — Telephone Encounter (Signed)
Feeling much better.  Has had OT and PT in yesterday.  Has followed up with orthopedist.  Will call if any questions or concerns.

## 2017-04-17 DIAGNOSIS — F0390 Unspecified dementia without behavioral disturbance: Secondary | ICD-10-CM | POA: Diagnosis not present

## 2017-04-17 DIAGNOSIS — R262 Difficulty in walking, not elsewhere classified: Secondary | ICD-10-CM | POA: Diagnosis not present

## 2017-04-17 DIAGNOSIS — I2510 Atherosclerotic heart disease of native coronary artery without angina pectoris: Secondary | ICD-10-CM | POA: Diagnosis not present

## 2017-04-17 DIAGNOSIS — R296 Repeated falls: Secondary | ICD-10-CM | POA: Diagnosis not present

## 2017-04-17 DIAGNOSIS — Z9181 History of falling: Secondary | ICD-10-CM | POA: Diagnosis not present

## 2017-04-17 DIAGNOSIS — S42012D Anterior displaced fracture of sternal end of left clavicle, subsequent encounter for fracture with routine healing: Secondary | ICD-10-CM | POA: Diagnosis not present

## 2017-04-18 DIAGNOSIS — I714 Abdominal aortic aneurysm, without rupture: Secondary | ICD-10-CM | POA: Diagnosis not present

## 2017-04-18 DIAGNOSIS — Z87891 Personal history of nicotine dependence: Secondary | ICD-10-CM | POA: Diagnosis not present

## 2017-04-18 DIAGNOSIS — Z95828 Presence of other vascular implants and grafts: Secondary | ICD-10-CM | POA: Diagnosis not present

## 2017-04-18 DIAGNOSIS — S42002S Fracture of unspecified part of left clavicle, sequela: Secondary | ICD-10-CM | POA: Diagnosis not present

## 2017-04-18 DIAGNOSIS — Z7902 Long term (current) use of antithrombotics/antiplatelets: Secondary | ICD-10-CM | POA: Diagnosis not present

## 2017-04-18 DIAGNOSIS — R262 Difficulty in walking, not elsewhere classified: Secondary | ICD-10-CM | POA: Diagnosis not present

## 2017-04-18 DIAGNOSIS — I609 Nontraumatic subarachnoid hemorrhage, unspecified: Secondary | ICD-10-CM | POA: Diagnosis not present

## 2017-04-18 DIAGNOSIS — Z9989 Dependence on other enabling machines and devices: Secondary | ICD-10-CM | POA: Diagnosis not present

## 2017-04-18 DIAGNOSIS — Z7982 Long term (current) use of aspirin: Secondary | ICD-10-CM | POA: Diagnosis not present

## 2017-04-18 DIAGNOSIS — S42002D Fracture of unspecified part of left clavicle, subsequent encounter for fracture with routine healing: Secondary | ICD-10-CM | POA: Diagnosis not present

## 2017-04-19 DIAGNOSIS — I2510 Atherosclerotic heart disease of native coronary artery without angina pectoris: Secondary | ICD-10-CM | POA: Diagnosis not present

## 2017-04-19 DIAGNOSIS — F0390 Unspecified dementia without behavioral disturbance: Secondary | ICD-10-CM | POA: Diagnosis not present

## 2017-04-19 DIAGNOSIS — R296 Repeated falls: Secondary | ICD-10-CM | POA: Diagnosis not present

## 2017-04-19 DIAGNOSIS — S42012D Anterior displaced fracture of sternal end of left clavicle, subsequent encounter for fracture with routine healing: Secondary | ICD-10-CM | POA: Diagnosis not present

## 2017-04-19 DIAGNOSIS — R262 Difficulty in walking, not elsewhere classified: Secondary | ICD-10-CM | POA: Diagnosis not present

## 2017-04-19 DIAGNOSIS — Z9181 History of falling: Secondary | ICD-10-CM | POA: Diagnosis not present

## 2017-04-21 DIAGNOSIS — R262 Difficulty in walking, not elsewhere classified: Secondary | ICD-10-CM | POA: Diagnosis not present

## 2017-04-21 DIAGNOSIS — F0390 Unspecified dementia without behavioral disturbance: Secondary | ICD-10-CM | POA: Diagnosis not present

## 2017-04-21 DIAGNOSIS — I2510 Atherosclerotic heart disease of native coronary artery without angina pectoris: Secondary | ICD-10-CM | POA: Diagnosis not present

## 2017-04-21 DIAGNOSIS — R296 Repeated falls: Secondary | ICD-10-CM | POA: Diagnosis not present

## 2017-04-21 DIAGNOSIS — S42012D Anterior displaced fracture of sternal end of left clavicle, subsequent encounter for fracture with routine healing: Secondary | ICD-10-CM | POA: Diagnosis not present

## 2017-04-21 DIAGNOSIS — Z9181 History of falling: Secondary | ICD-10-CM | POA: Diagnosis not present

## 2017-04-24 DIAGNOSIS — I2510 Atherosclerotic heart disease of native coronary artery without angina pectoris: Secondary | ICD-10-CM | POA: Diagnosis not present

## 2017-04-24 DIAGNOSIS — R262 Difficulty in walking, not elsewhere classified: Secondary | ICD-10-CM | POA: Diagnosis not present

## 2017-04-24 DIAGNOSIS — F0390 Unspecified dementia without behavioral disturbance: Secondary | ICD-10-CM | POA: Diagnosis not present

## 2017-04-24 DIAGNOSIS — Z9181 History of falling: Secondary | ICD-10-CM | POA: Diagnosis not present

## 2017-04-24 DIAGNOSIS — S42012D Anterior displaced fracture of sternal end of left clavicle, subsequent encounter for fracture with routine healing: Secondary | ICD-10-CM | POA: Diagnosis not present

## 2017-04-24 DIAGNOSIS — R296 Repeated falls: Secondary | ICD-10-CM | POA: Diagnosis not present

## 2017-04-25 DIAGNOSIS — F0390 Unspecified dementia without behavioral disturbance: Secondary | ICD-10-CM | POA: Diagnosis not present

## 2017-04-25 DIAGNOSIS — S42012D Anterior displaced fracture of sternal end of left clavicle, subsequent encounter for fracture with routine healing: Secondary | ICD-10-CM | POA: Diagnosis not present

## 2017-04-25 DIAGNOSIS — R262 Difficulty in walking, not elsewhere classified: Secondary | ICD-10-CM | POA: Diagnosis not present

## 2017-04-25 DIAGNOSIS — R296 Repeated falls: Secondary | ICD-10-CM | POA: Diagnosis not present

## 2017-04-25 DIAGNOSIS — I2510 Atherosclerotic heart disease of native coronary artery without angina pectoris: Secondary | ICD-10-CM | POA: Diagnosis not present

## 2017-04-25 DIAGNOSIS — Z9181 History of falling: Secondary | ICD-10-CM | POA: Diagnosis not present

## 2017-04-26 DIAGNOSIS — I2510 Atherosclerotic heart disease of native coronary artery without angina pectoris: Secondary | ICD-10-CM | POA: Diagnosis not present

## 2017-04-26 DIAGNOSIS — F0390 Unspecified dementia without behavioral disturbance: Secondary | ICD-10-CM | POA: Diagnosis not present

## 2017-04-26 DIAGNOSIS — R296 Repeated falls: Secondary | ICD-10-CM | POA: Diagnosis not present

## 2017-04-26 DIAGNOSIS — S42012D Anterior displaced fracture of sternal end of left clavicle, subsequent encounter for fracture with routine healing: Secondary | ICD-10-CM | POA: Diagnosis not present

## 2017-04-26 DIAGNOSIS — R262 Difficulty in walking, not elsewhere classified: Secondary | ICD-10-CM | POA: Diagnosis not present

## 2017-04-26 DIAGNOSIS — Z9181 History of falling: Secondary | ICD-10-CM | POA: Diagnosis not present

## 2017-04-28 DIAGNOSIS — R296 Repeated falls: Secondary | ICD-10-CM | POA: Diagnosis not present

## 2017-04-28 DIAGNOSIS — I2510 Atherosclerotic heart disease of native coronary artery without angina pectoris: Secondary | ICD-10-CM | POA: Diagnosis not present

## 2017-04-28 DIAGNOSIS — R262 Difficulty in walking, not elsewhere classified: Secondary | ICD-10-CM | POA: Diagnosis not present

## 2017-04-28 DIAGNOSIS — F0390 Unspecified dementia without behavioral disturbance: Secondary | ICD-10-CM | POA: Diagnosis not present

## 2017-04-28 DIAGNOSIS — S42012D Anterior displaced fracture of sternal end of left clavicle, subsequent encounter for fracture with routine healing: Secondary | ICD-10-CM | POA: Diagnosis not present

## 2017-04-28 DIAGNOSIS — Z9181 History of falling: Secondary | ICD-10-CM | POA: Diagnosis not present

## 2017-04-30 ENCOUNTER — Encounter: Payer: Self-pay | Admitting: Internal Medicine

## 2017-05-02 DIAGNOSIS — S42012D Anterior displaced fracture of sternal end of left clavicle, subsequent encounter for fracture with routine healing: Secondary | ICD-10-CM | POA: Diagnosis not present

## 2017-05-02 DIAGNOSIS — I2510 Atherosclerotic heart disease of native coronary artery without angina pectoris: Secondary | ICD-10-CM | POA: Diagnosis not present

## 2017-05-02 DIAGNOSIS — Z9181 History of falling: Secondary | ICD-10-CM | POA: Diagnosis not present

## 2017-05-02 DIAGNOSIS — R262 Difficulty in walking, not elsewhere classified: Secondary | ICD-10-CM | POA: Diagnosis not present

## 2017-05-02 DIAGNOSIS — F0390 Unspecified dementia without behavioral disturbance: Secondary | ICD-10-CM | POA: Diagnosis not present

## 2017-05-02 DIAGNOSIS — R296 Repeated falls: Secondary | ICD-10-CM | POA: Diagnosis not present

## 2017-05-03 DIAGNOSIS — R296 Repeated falls: Secondary | ICD-10-CM | POA: Diagnosis not present

## 2017-05-03 DIAGNOSIS — S42012D Anterior displaced fracture of sternal end of left clavicle, subsequent encounter for fracture with routine healing: Secondary | ICD-10-CM | POA: Diagnosis not present

## 2017-05-03 DIAGNOSIS — R262 Difficulty in walking, not elsewhere classified: Secondary | ICD-10-CM | POA: Diagnosis not present

## 2017-05-03 DIAGNOSIS — I2510 Atherosclerotic heart disease of native coronary artery without angina pectoris: Secondary | ICD-10-CM | POA: Diagnosis not present

## 2017-05-03 DIAGNOSIS — F0390 Unspecified dementia without behavioral disturbance: Secondary | ICD-10-CM | POA: Diagnosis not present

## 2017-05-03 DIAGNOSIS — Z9181 History of falling: Secondary | ICD-10-CM | POA: Diagnosis not present

## 2017-05-05 DIAGNOSIS — R296 Repeated falls: Secondary | ICD-10-CM | POA: Diagnosis not present

## 2017-05-05 DIAGNOSIS — R262 Difficulty in walking, not elsewhere classified: Secondary | ICD-10-CM | POA: Diagnosis not present

## 2017-05-05 DIAGNOSIS — I2510 Atherosclerotic heart disease of native coronary artery without angina pectoris: Secondary | ICD-10-CM | POA: Diagnosis not present

## 2017-05-05 DIAGNOSIS — S42012D Anterior displaced fracture of sternal end of left clavicle, subsequent encounter for fracture with routine healing: Secondary | ICD-10-CM | POA: Diagnosis not present

## 2017-05-05 DIAGNOSIS — F0390 Unspecified dementia without behavioral disturbance: Secondary | ICD-10-CM | POA: Diagnosis not present

## 2017-05-05 DIAGNOSIS — Z9181 History of falling: Secondary | ICD-10-CM | POA: Diagnosis not present

## 2017-05-08 DIAGNOSIS — R296 Repeated falls: Secondary | ICD-10-CM | POA: Diagnosis not present

## 2017-05-08 DIAGNOSIS — Z9181 History of falling: Secondary | ICD-10-CM | POA: Diagnosis not present

## 2017-05-08 DIAGNOSIS — F0390 Unspecified dementia without behavioral disturbance: Secondary | ICD-10-CM | POA: Diagnosis not present

## 2017-05-08 DIAGNOSIS — I2510 Atherosclerotic heart disease of native coronary artery without angina pectoris: Secondary | ICD-10-CM | POA: Diagnosis not present

## 2017-05-08 DIAGNOSIS — R262 Difficulty in walking, not elsewhere classified: Secondary | ICD-10-CM | POA: Diagnosis not present

## 2017-05-08 DIAGNOSIS — S42012D Anterior displaced fracture of sternal end of left clavicle, subsequent encounter for fracture with routine healing: Secondary | ICD-10-CM | POA: Diagnosis not present

## 2017-05-10 DIAGNOSIS — R262 Difficulty in walking, not elsewhere classified: Secondary | ICD-10-CM | POA: Diagnosis not present

## 2017-05-10 DIAGNOSIS — R296 Repeated falls: Secondary | ICD-10-CM | POA: Diagnosis not present

## 2017-05-10 DIAGNOSIS — Z9181 History of falling: Secondary | ICD-10-CM | POA: Diagnosis not present

## 2017-05-10 DIAGNOSIS — I2510 Atherosclerotic heart disease of native coronary artery without angina pectoris: Secondary | ICD-10-CM | POA: Diagnosis not present

## 2017-05-10 DIAGNOSIS — F0390 Unspecified dementia without behavioral disturbance: Secondary | ICD-10-CM | POA: Diagnosis not present

## 2017-05-10 DIAGNOSIS — S42012D Anterior displaced fracture of sternal end of left clavicle, subsequent encounter for fracture with routine healing: Secondary | ICD-10-CM | POA: Diagnosis not present

## 2017-05-12 DIAGNOSIS — S42012D Anterior displaced fracture of sternal end of left clavicle, subsequent encounter for fracture with routine healing: Secondary | ICD-10-CM | POA: Diagnosis not present

## 2017-05-12 DIAGNOSIS — F0390 Unspecified dementia without behavioral disturbance: Secondary | ICD-10-CM | POA: Diagnosis not present

## 2017-05-12 DIAGNOSIS — R296 Repeated falls: Secondary | ICD-10-CM | POA: Diagnosis not present

## 2017-05-12 DIAGNOSIS — R262 Difficulty in walking, not elsewhere classified: Secondary | ICD-10-CM | POA: Diagnosis not present

## 2017-05-12 DIAGNOSIS — I2510 Atherosclerotic heart disease of native coronary artery without angina pectoris: Secondary | ICD-10-CM | POA: Diagnosis not present

## 2017-05-12 DIAGNOSIS — Z9181 History of falling: Secondary | ICD-10-CM | POA: Diagnosis not present

## 2017-05-15 DIAGNOSIS — F0390 Unspecified dementia without behavioral disturbance: Secondary | ICD-10-CM | POA: Diagnosis not present

## 2017-05-15 DIAGNOSIS — R296 Repeated falls: Secondary | ICD-10-CM | POA: Diagnosis not present

## 2017-05-15 DIAGNOSIS — I2510 Atherosclerotic heart disease of native coronary artery without angina pectoris: Secondary | ICD-10-CM | POA: Diagnosis not present

## 2017-05-15 DIAGNOSIS — Z9181 History of falling: Secondary | ICD-10-CM | POA: Diagnosis not present

## 2017-05-15 DIAGNOSIS — S42012D Anterior displaced fracture of sternal end of left clavicle, subsequent encounter for fracture with routine healing: Secondary | ICD-10-CM | POA: Diagnosis not present

## 2017-05-15 DIAGNOSIS — R262 Difficulty in walking, not elsewhere classified: Secondary | ICD-10-CM | POA: Diagnosis not present

## 2017-05-16 DIAGNOSIS — I2510 Atherosclerotic heart disease of native coronary artery without angina pectoris: Secondary | ICD-10-CM | POA: Diagnosis not present

## 2017-05-16 DIAGNOSIS — Z9181 History of falling: Secondary | ICD-10-CM | POA: Diagnosis not present

## 2017-05-16 DIAGNOSIS — S42012D Anterior displaced fracture of sternal end of left clavicle, subsequent encounter for fracture with routine healing: Secondary | ICD-10-CM | POA: Diagnosis not present

## 2017-05-16 DIAGNOSIS — R296 Repeated falls: Secondary | ICD-10-CM | POA: Diagnosis not present

## 2017-05-16 DIAGNOSIS — R262 Difficulty in walking, not elsewhere classified: Secondary | ICD-10-CM | POA: Diagnosis not present

## 2017-05-16 DIAGNOSIS — F0390 Unspecified dementia without behavioral disturbance: Secondary | ICD-10-CM | POA: Diagnosis not present

## 2017-05-17 DIAGNOSIS — Z9181 History of falling: Secondary | ICD-10-CM | POA: Diagnosis not present

## 2017-05-17 DIAGNOSIS — I2510 Atherosclerotic heart disease of native coronary artery without angina pectoris: Secondary | ICD-10-CM | POA: Diagnosis not present

## 2017-05-17 DIAGNOSIS — R296 Repeated falls: Secondary | ICD-10-CM | POA: Diagnosis not present

## 2017-05-17 DIAGNOSIS — F0390 Unspecified dementia without behavioral disturbance: Secondary | ICD-10-CM | POA: Diagnosis not present

## 2017-05-17 DIAGNOSIS — R262 Difficulty in walking, not elsewhere classified: Secondary | ICD-10-CM | POA: Diagnosis not present

## 2017-05-17 DIAGNOSIS — S42012D Anterior displaced fracture of sternal end of left clavicle, subsequent encounter for fracture with routine healing: Secondary | ICD-10-CM | POA: Diagnosis not present

## 2017-05-19 DIAGNOSIS — S42012D Anterior displaced fracture of sternal end of left clavicle, subsequent encounter for fracture with routine healing: Secondary | ICD-10-CM | POA: Diagnosis not present

## 2017-05-19 DIAGNOSIS — I2510 Atherosclerotic heart disease of native coronary artery without angina pectoris: Secondary | ICD-10-CM | POA: Diagnosis not present

## 2017-05-19 DIAGNOSIS — R296 Repeated falls: Secondary | ICD-10-CM | POA: Diagnosis not present

## 2017-05-19 DIAGNOSIS — F0390 Unspecified dementia without behavioral disturbance: Secondary | ICD-10-CM | POA: Diagnosis not present

## 2017-05-19 DIAGNOSIS — R262 Difficulty in walking, not elsewhere classified: Secondary | ICD-10-CM | POA: Diagnosis not present

## 2017-05-19 DIAGNOSIS — Z9181 History of falling: Secondary | ICD-10-CM | POA: Diagnosis not present

## 2017-05-24 DIAGNOSIS — I2510 Atherosclerotic heart disease of native coronary artery without angina pectoris: Secondary | ICD-10-CM | POA: Diagnosis not present

## 2017-05-24 DIAGNOSIS — R262 Difficulty in walking, not elsewhere classified: Secondary | ICD-10-CM | POA: Diagnosis not present

## 2017-05-24 DIAGNOSIS — Z9181 History of falling: Secondary | ICD-10-CM | POA: Diagnosis not present

## 2017-05-24 DIAGNOSIS — F0390 Unspecified dementia without behavioral disturbance: Secondary | ICD-10-CM | POA: Diagnosis not present

## 2017-05-24 DIAGNOSIS — S42012D Anterior displaced fracture of sternal end of left clavicle, subsequent encounter for fracture with routine healing: Secondary | ICD-10-CM | POA: Diagnosis not present

## 2017-05-24 DIAGNOSIS — R296 Repeated falls: Secondary | ICD-10-CM | POA: Diagnosis not present

## 2017-05-25 DIAGNOSIS — R296 Repeated falls: Secondary | ICD-10-CM | POA: Diagnosis not present

## 2017-05-25 DIAGNOSIS — H2513 Age-related nuclear cataract, bilateral: Secondary | ICD-10-CM | POA: Diagnosis not present

## 2017-05-25 DIAGNOSIS — I2510 Atherosclerotic heart disease of native coronary artery without angina pectoris: Secondary | ICD-10-CM | POA: Diagnosis not present

## 2017-05-25 DIAGNOSIS — Z9181 History of falling: Secondary | ICD-10-CM | POA: Diagnosis not present

## 2017-05-25 DIAGNOSIS — H532 Diplopia: Secondary | ICD-10-CM | POA: Diagnosis not present

## 2017-05-25 DIAGNOSIS — H5203 Hypermetropia, bilateral: Secondary | ICD-10-CM | POA: Diagnosis not present

## 2017-05-25 DIAGNOSIS — R262 Difficulty in walking, not elsewhere classified: Secondary | ICD-10-CM | POA: Diagnosis not present

## 2017-05-25 DIAGNOSIS — F0390 Unspecified dementia without behavioral disturbance: Secondary | ICD-10-CM | POA: Diagnosis not present

## 2017-05-25 DIAGNOSIS — S42012D Anterior displaced fracture of sternal end of left clavicle, subsequent encounter for fracture with routine healing: Secondary | ICD-10-CM | POA: Diagnosis not present

## 2017-05-26 DIAGNOSIS — F0390 Unspecified dementia without behavioral disturbance: Secondary | ICD-10-CM | POA: Diagnosis not present

## 2017-05-26 DIAGNOSIS — R296 Repeated falls: Secondary | ICD-10-CM | POA: Diagnosis not present

## 2017-05-26 DIAGNOSIS — Z9181 History of falling: Secondary | ICD-10-CM | POA: Diagnosis not present

## 2017-05-26 DIAGNOSIS — S42012D Anterior displaced fracture of sternal end of left clavicle, subsequent encounter for fracture with routine healing: Secondary | ICD-10-CM | POA: Diagnosis not present

## 2017-05-26 DIAGNOSIS — I2510 Atherosclerotic heart disease of native coronary artery without angina pectoris: Secondary | ICD-10-CM | POA: Diagnosis not present

## 2017-05-26 DIAGNOSIS — R262 Difficulty in walking, not elsewhere classified: Secondary | ICD-10-CM | POA: Diagnosis not present

## 2017-05-29 DIAGNOSIS — F0390 Unspecified dementia without behavioral disturbance: Secondary | ICD-10-CM | POA: Diagnosis not present

## 2017-05-29 DIAGNOSIS — S42012D Anterior displaced fracture of sternal end of left clavicle, subsequent encounter for fracture with routine healing: Secondary | ICD-10-CM | POA: Diagnosis not present

## 2017-05-29 DIAGNOSIS — I2510 Atherosclerotic heart disease of native coronary artery without angina pectoris: Secondary | ICD-10-CM | POA: Diagnosis not present

## 2017-05-29 DIAGNOSIS — Z9181 History of falling: Secondary | ICD-10-CM | POA: Diagnosis not present

## 2017-05-29 DIAGNOSIS — R262 Difficulty in walking, not elsewhere classified: Secondary | ICD-10-CM | POA: Diagnosis not present

## 2017-05-29 DIAGNOSIS — R296 Repeated falls: Secondary | ICD-10-CM | POA: Diagnosis not present

## 2017-05-31 DIAGNOSIS — R262 Difficulty in walking, not elsewhere classified: Secondary | ICD-10-CM | POA: Diagnosis not present

## 2017-05-31 DIAGNOSIS — I2510 Atherosclerotic heart disease of native coronary artery without angina pectoris: Secondary | ICD-10-CM | POA: Diagnosis not present

## 2017-05-31 DIAGNOSIS — Z9181 History of falling: Secondary | ICD-10-CM | POA: Diagnosis not present

## 2017-05-31 DIAGNOSIS — R296 Repeated falls: Secondary | ICD-10-CM | POA: Diagnosis not present

## 2017-05-31 DIAGNOSIS — F0390 Unspecified dementia without behavioral disturbance: Secondary | ICD-10-CM | POA: Diagnosis not present

## 2017-05-31 DIAGNOSIS — S42011D Anterior displaced fracture of sternal end of right clavicle, subsequent encounter for fracture with routine healing: Secondary | ICD-10-CM | POA: Diagnosis not present

## 2017-05-31 DIAGNOSIS — S42012D Anterior displaced fracture of sternal end of left clavicle, subsequent encounter for fracture with routine healing: Secondary | ICD-10-CM | POA: Diagnosis not present

## 2017-05-31 DIAGNOSIS — S42002D Fracture of unspecified part of left clavicle, subsequent encounter for fracture with routine healing: Secondary | ICD-10-CM | POA: Diagnosis not present

## 2017-06-05 DIAGNOSIS — R262 Difficulty in walking, not elsewhere classified: Secondary | ICD-10-CM | POA: Diagnosis not present

## 2017-06-05 DIAGNOSIS — I10 Essential (primary) hypertension: Secondary | ICD-10-CM | POA: Diagnosis not present

## 2017-06-05 DIAGNOSIS — I6782 Cerebral ischemia: Secondary | ICD-10-CM | POA: Diagnosis not present

## 2017-06-05 DIAGNOSIS — M25462 Effusion, left knee: Secondary | ICD-10-CM | POA: Diagnosis not present

## 2017-06-05 DIAGNOSIS — S79922A Unspecified injury of left thigh, initial encounter: Secondary | ICD-10-CM | POA: Diagnosis not present

## 2017-06-05 DIAGNOSIS — S0990XA Unspecified injury of head, initial encounter: Secondary | ICD-10-CM | POA: Diagnosis not present

## 2017-06-05 DIAGNOSIS — W108XXA Fall (on) (from) other stairs and steps, initial encounter: Secondary | ICD-10-CM | POA: Diagnosis not present

## 2017-06-05 DIAGNOSIS — S82142A Displaced bicondylar fracture of left tibia, initial encounter for closed fracture: Secondary | ICD-10-CM | POA: Diagnosis not present

## 2017-06-05 DIAGNOSIS — I2510 Atherosclerotic heart disease of native coronary artery without angina pectoris: Secondary | ICD-10-CM | POA: Diagnosis not present

## 2017-06-05 DIAGNOSIS — S82145A Nondisplaced bicondylar fracture of left tibia, initial encounter for closed fracture: Secondary | ICD-10-CM | POA: Diagnosis not present

## 2017-06-05 DIAGNOSIS — Z23 Encounter for immunization: Secondary | ICD-10-CM | POA: Diagnosis not present

## 2017-06-05 DIAGNOSIS — S82232A Displaced oblique fracture of shaft of left tibia, initial encounter for closed fracture: Secondary | ICD-10-CM | POA: Diagnosis not present

## 2017-06-05 DIAGNOSIS — Z9181 History of falling: Secondary | ICD-10-CM | POA: Diagnosis not present

## 2017-06-05 DIAGNOSIS — G9389 Other specified disorders of brain: Secondary | ICD-10-CM | POA: Diagnosis not present

## 2017-06-05 DIAGNOSIS — S82455A Nondisplaced comminuted fracture of shaft of left fibula, initial encounter for closed fracture: Secondary | ICD-10-CM | POA: Diagnosis not present

## 2017-06-05 DIAGNOSIS — Z66 Do not resuscitate: Secondary | ICD-10-CM | POA: Diagnosis not present

## 2017-06-05 DIAGNOSIS — S0101XA Laceration without foreign body of scalp, initial encounter: Secondary | ICD-10-CM | POA: Diagnosis not present

## 2017-06-05 DIAGNOSIS — S8992XA Unspecified injury of left lower leg, initial encounter: Secondary | ICD-10-CM | POA: Diagnosis not present

## 2017-06-05 DIAGNOSIS — Z7901 Long term (current) use of anticoagulants: Secondary | ICD-10-CM | POA: Diagnosis not present

## 2017-06-05 DIAGNOSIS — M1612 Unilateral primary osteoarthritis, left hip: Secondary | ICD-10-CM | POA: Diagnosis not present

## 2017-06-06 DIAGNOSIS — S82202A Unspecified fracture of shaft of left tibia, initial encounter for closed fracture: Secondary | ICD-10-CM | POA: Diagnosis not present

## 2017-06-06 DIAGNOSIS — S82142A Displaced bicondylar fracture of left tibia, initial encounter for closed fracture: Secondary | ICD-10-CM | POA: Diagnosis not present

## 2017-06-06 DIAGNOSIS — F0390 Unspecified dementia without behavioral disturbance: Secondary | ICD-10-CM | POA: Diagnosis present

## 2017-06-06 DIAGNOSIS — Z9181 History of falling: Secondary | ICD-10-CM | POA: Diagnosis not present

## 2017-06-06 DIAGNOSIS — S82142D Displaced bicondylar fracture of left tibia, subsequent encounter for closed fracture with routine healing: Secondary | ICD-10-CM | POA: Diagnosis not present

## 2017-06-06 DIAGNOSIS — R269 Unspecified abnormalities of gait and mobility: Secondary | ICD-10-CM | POA: Diagnosis not present

## 2017-06-06 DIAGNOSIS — Z7902 Long term (current) use of antithrombotics/antiplatelets: Secondary | ICD-10-CM | POA: Diagnosis not present

## 2017-06-06 DIAGNOSIS — I10 Essential (primary) hypertension: Secondary | ICD-10-CM | POA: Diagnosis present

## 2017-06-06 DIAGNOSIS — Z7951 Long term (current) use of inhaled steroids: Secondary | ICD-10-CM | POA: Diagnosis not present

## 2017-06-06 DIAGNOSIS — S82232A Displaced oblique fracture of shaft of left tibia, initial encounter for closed fracture: Secondary | ICD-10-CM | POA: Diagnosis not present

## 2017-06-06 DIAGNOSIS — F329 Major depressive disorder, single episode, unspecified: Secondary | ICD-10-CM | POA: Diagnosis not present

## 2017-06-06 DIAGNOSIS — Z79899 Other long term (current) drug therapy: Secondary | ICD-10-CM | POA: Diagnosis not present

## 2017-06-06 DIAGNOSIS — W108XXA Fall (on) (from) other stairs and steps, initial encounter: Secondary | ICD-10-CM | POA: Diagnosis not present

## 2017-06-06 DIAGNOSIS — E785 Hyperlipidemia, unspecified: Secondary | ICD-10-CM | POA: Diagnosis present

## 2017-06-06 DIAGNOSIS — S82455A Nondisplaced comminuted fracture of shaft of left fibula, initial encounter for closed fracture: Secondary | ICD-10-CM | POA: Diagnosis not present

## 2017-06-06 DIAGNOSIS — R2689 Other abnormalities of gait and mobility: Secondary | ICD-10-CM | POA: Diagnosis not present

## 2017-06-06 DIAGNOSIS — M25462 Effusion, left knee: Secondary | ICD-10-CM | POA: Diagnosis not present

## 2017-06-06 DIAGNOSIS — N400 Enlarged prostate without lower urinary tract symptoms: Secondary | ICD-10-CM | POA: Diagnosis present

## 2017-06-06 DIAGNOSIS — S82832A Other fracture of upper and lower end of left fibula, initial encounter for closed fracture: Secondary | ICD-10-CM | POA: Diagnosis not present

## 2017-06-06 DIAGNOSIS — Z7982 Long term (current) use of aspirin: Secondary | ICD-10-CM | POA: Diagnosis not present

## 2017-06-06 DIAGNOSIS — I2510 Atherosclerotic heart disease of native coronary artery without angina pectoris: Secondary | ICD-10-CM | POA: Diagnosis present

## 2017-06-06 DIAGNOSIS — Z23 Encounter for immunization: Secondary | ICD-10-CM | POA: Diagnosis not present

## 2017-06-06 DIAGNOSIS — M6281 Muscle weakness (generalized): Secondary | ICD-10-CM | POA: Diagnosis not present

## 2017-06-06 DIAGNOSIS — S82145A Nondisplaced bicondylar fracture of left tibia, initial encounter for closed fracture: Secondary | ICD-10-CM | POA: Diagnosis present

## 2017-06-06 DIAGNOSIS — R488 Other symbolic dysfunctions: Secondary | ICD-10-CM | POA: Diagnosis not present

## 2017-06-06 DIAGNOSIS — S0101XA Laceration without foreign body of scalp, initial encounter: Secondary | ICD-10-CM | POA: Diagnosis present

## 2017-06-06 DIAGNOSIS — Z888 Allergy status to other drugs, medicaments and biological substances status: Secondary | ICD-10-CM | POA: Diagnosis not present

## 2017-06-06 DIAGNOSIS — W19XXXD Unspecified fall, subsequent encounter: Secondary | ICD-10-CM | POA: Diagnosis not present

## 2017-06-06 DIAGNOSIS — Z66 Do not resuscitate: Secondary | ICD-10-CM | POA: Diagnosis present

## 2017-06-09 DIAGNOSIS — I82409 Acute embolism and thrombosis of unspecified deep veins of unspecified lower extremity: Secondary | ICD-10-CM | POA: Diagnosis not present

## 2017-06-09 DIAGNOSIS — S82142A Displaced bicondylar fracture of left tibia, initial encounter for closed fracture: Secondary | ICD-10-CM | POA: Diagnosis not present

## 2017-06-09 DIAGNOSIS — M25562 Pain in left knee: Secondary | ICD-10-CM | POA: Diagnosis not present

## 2017-06-09 DIAGNOSIS — F0390 Unspecified dementia without behavioral disturbance: Secondary | ICD-10-CM | POA: Diagnosis not present

## 2017-06-09 DIAGNOSIS — D649 Anemia, unspecified: Secondary | ICD-10-CM | POA: Diagnosis not present

## 2017-06-09 DIAGNOSIS — M79662 Pain in left lower leg: Secondary | ICD-10-CM | POA: Diagnosis not present

## 2017-06-09 DIAGNOSIS — S82202D Unspecified fracture of shaft of left tibia, subsequent encounter for closed fracture with routine healing: Secondary | ICD-10-CM | POA: Diagnosis not present

## 2017-06-09 DIAGNOSIS — R52 Pain, unspecified: Secondary | ICD-10-CM | POA: Diagnosis not present

## 2017-06-09 DIAGNOSIS — N400 Enlarged prostate without lower urinary tract symptoms: Secondary | ICD-10-CM | POA: Diagnosis not present

## 2017-06-09 DIAGNOSIS — R2689 Other abnormalities of gait and mobility: Secondary | ICD-10-CM | POA: Diagnosis not present

## 2017-06-09 DIAGNOSIS — M6281 Muscle weakness (generalized): Secondary | ICD-10-CM | POA: Diagnosis not present

## 2017-06-09 DIAGNOSIS — W19XXXD Unspecified fall, subsequent encounter: Secondary | ICD-10-CM | POA: Diagnosis not present

## 2017-06-09 DIAGNOSIS — R269 Unspecified abnormalities of gait and mobility: Secondary | ICD-10-CM | POA: Diagnosis not present

## 2017-06-09 DIAGNOSIS — R531 Weakness: Secondary | ICD-10-CM | POA: Diagnosis not present

## 2017-06-09 DIAGNOSIS — R488 Other symbolic dysfunctions: Secondary | ICD-10-CM | POA: Diagnosis not present

## 2017-06-09 DIAGNOSIS — E785 Hyperlipidemia, unspecified: Secondary | ICD-10-CM | POA: Diagnosis not present

## 2017-06-09 DIAGNOSIS — I10 Essential (primary) hypertension: Secondary | ICD-10-CM | POA: Diagnosis not present

## 2017-06-09 DIAGNOSIS — S82142D Displaced bicondylar fracture of left tibia, subsequent encounter for closed fracture with routine healing: Secondary | ICD-10-CM | POA: Diagnosis not present

## 2017-06-09 DIAGNOSIS — S82202A Unspecified fracture of shaft of left tibia, initial encounter for closed fracture: Secondary | ICD-10-CM | POA: Diagnosis not present

## 2017-06-09 DIAGNOSIS — G8929 Other chronic pain: Secondary | ICD-10-CM | POA: Diagnosis not present

## 2017-06-09 DIAGNOSIS — I2510 Atherosclerotic heart disease of native coronary artery without angina pectoris: Secondary | ICD-10-CM | POA: Diagnosis not present

## 2017-06-09 DIAGNOSIS — Z23 Encounter for immunization: Secondary | ICD-10-CM | POA: Diagnosis not present

## 2017-06-09 DIAGNOSIS — W108XXA Fall (on) (from) other stairs and steps, initial encounter: Secondary | ICD-10-CM | POA: Diagnosis not present

## 2017-06-09 DIAGNOSIS — F329 Major depressive disorder, single episode, unspecified: Secondary | ICD-10-CM | POA: Diagnosis not present

## 2017-06-12 DIAGNOSIS — D649 Anemia, unspecified: Secondary | ICD-10-CM | POA: Diagnosis not present

## 2017-06-12 DIAGNOSIS — R531 Weakness: Secondary | ICD-10-CM | POA: Diagnosis not present

## 2017-06-12 DIAGNOSIS — S82142D Displaced bicondylar fracture of left tibia, subsequent encounter for closed fracture with routine healing: Secondary | ICD-10-CM | POA: Diagnosis not present

## 2017-06-12 DIAGNOSIS — I82409 Acute embolism and thrombosis of unspecified deep veins of unspecified lower extremity: Secondary | ICD-10-CM | POA: Diagnosis not present

## 2017-06-12 DIAGNOSIS — R52 Pain, unspecified: Secondary | ICD-10-CM | POA: Diagnosis not present

## 2017-06-12 DIAGNOSIS — R269 Unspecified abnormalities of gait and mobility: Secondary | ICD-10-CM | POA: Diagnosis not present

## 2017-06-12 DIAGNOSIS — G8929 Other chronic pain: Secondary | ICD-10-CM | POA: Diagnosis not present

## 2017-06-14 DIAGNOSIS — S82202D Unspecified fracture of shaft of left tibia, subsequent encounter for closed fracture with routine healing: Secondary | ICD-10-CM | POA: Diagnosis not present

## 2017-06-14 DIAGNOSIS — I2510 Atherosclerotic heart disease of native coronary artery without angina pectoris: Secondary | ICD-10-CM | POA: Diagnosis not present

## 2017-06-14 DIAGNOSIS — R531 Weakness: Secondary | ICD-10-CM | POA: Diagnosis not present

## 2017-06-14 DIAGNOSIS — F329 Major depressive disorder, single episode, unspecified: Secondary | ICD-10-CM | POA: Diagnosis not present

## 2017-06-14 DIAGNOSIS — E785 Hyperlipidemia, unspecified: Secondary | ICD-10-CM | POA: Diagnosis not present

## 2017-06-14 DIAGNOSIS — N400 Enlarged prostate without lower urinary tract symptoms: Secondary | ICD-10-CM | POA: Diagnosis not present

## 2017-06-14 DIAGNOSIS — F0390 Unspecified dementia without behavioral disturbance: Secondary | ICD-10-CM | POA: Diagnosis not present

## 2017-07-07 DIAGNOSIS — M25562 Pain in left knee: Secondary | ICD-10-CM | POA: Diagnosis not present

## 2017-08-01 DIAGNOSIS — S82202D Unspecified fracture of shaft of left tibia, subsequent encounter for closed fracture with routine healing: Secondary | ICD-10-CM | POA: Diagnosis not present

## 2017-08-01 DIAGNOSIS — I2510 Atherosclerotic heart disease of native coronary artery without angina pectoris: Secondary | ICD-10-CM | POA: Diagnosis not present

## 2017-08-01 DIAGNOSIS — G309 Alzheimer's disease, unspecified: Secondary | ICD-10-CM | POA: Diagnosis not present

## 2017-08-01 DIAGNOSIS — E785 Hyperlipidemia, unspecified: Secondary | ICD-10-CM | POA: Diagnosis not present

## 2017-08-01 DIAGNOSIS — F329 Major depressive disorder, single episode, unspecified: Secondary | ICD-10-CM | POA: Diagnosis not present

## 2017-08-01 DIAGNOSIS — N400 Enlarged prostate without lower urinary tract symptoms: Secondary | ICD-10-CM | POA: Diagnosis not present

## 2017-08-07 DIAGNOSIS — M25562 Pain in left knee: Secondary | ICD-10-CM | POA: Diagnosis not present

## 2017-08-23 DIAGNOSIS — R262 Difficulty in walking, not elsewhere classified: Secondary | ICD-10-CM | POA: Diagnosis not present

## 2017-08-23 DIAGNOSIS — E785 Hyperlipidemia, unspecified: Secondary | ICD-10-CM | POA: Diagnosis not present

## 2017-08-23 DIAGNOSIS — I2510 Atherosclerotic heart disease of native coronary artery without angina pectoris: Secondary | ICD-10-CM | POA: Diagnosis not present

## 2017-08-23 DIAGNOSIS — R413 Other amnesia: Secondary | ICD-10-CM | POA: Diagnosis not present

## 2017-08-23 DIAGNOSIS — Z9181 History of falling: Secondary | ICD-10-CM | POA: Diagnosis not present

## 2017-08-23 DIAGNOSIS — Z87891 Personal history of nicotine dependence: Secondary | ICD-10-CM | POA: Diagnosis not present

## 2017-08-23 DIAGNOSIS — Z7982 Long term (current) use of aspirin: Secondary | ICD-10-CM | POA: Diagnosis not present

## 2017-08-23 DIAGNOSIS — Z79899 Other long term (current) drug therapy: Secondary | ICD-10-CM | POA: Diagnosis not present

## 2017-08-23 DIAGNOSIS — Z888 Allergy status to other drugs, medicaments and biological substances status: Secondary | ICD-10-CM | POA: Diagnosis not present

## 2018-02-09 IMAGING — CT CT CERVICAL SPINE W/O CM
4 of 8 series · 11 of 33 positions shown, 12 images · non-contrast
Comparison: No comparison exams available for direct review. Report
from 08/22/2002.

ADDENDUM:
Mild spinal stenosis C2-3.

Foraminal narrowing C3-4 thru C7-T1. Streak artifact from metallic
hardware slightly limits evaluation.
CLINICAL DATA: 79-year-old male post fall with laceration right
posterior head. Denies loss of consciousness. Initial encounter.
EXAM:
CT HEAD WITHOUT CONTRAST
CT CERVICAL SPINE WITHOUT CONTRAST
TECHNIQUE: Multidetector CT imaging of the head and cervical spine was
performed following the standard protocol without intravenous
contrast. Multiplanar CT image reconstructions of the cervical spine
were also generated.

[Series 6: coronal · coronal · 0.29mm/px · 1 of 73 slices shown]
[im 37/73  bone]
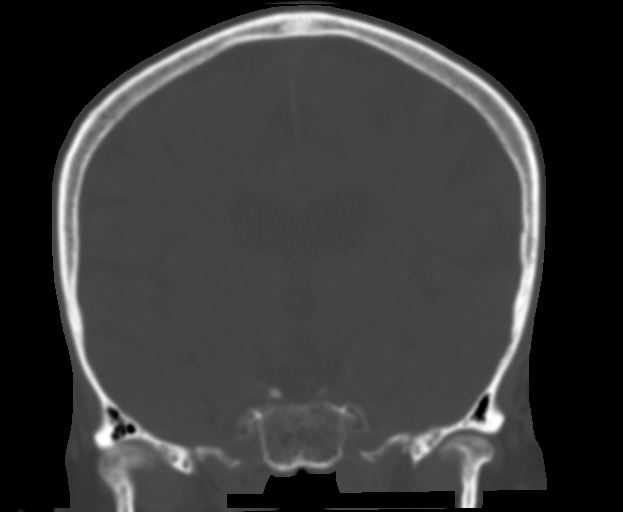

[Series 7: sagittal · sagittal · 0.29mm/px · 5 of 59 slices shown]
[im 10/59  bone]
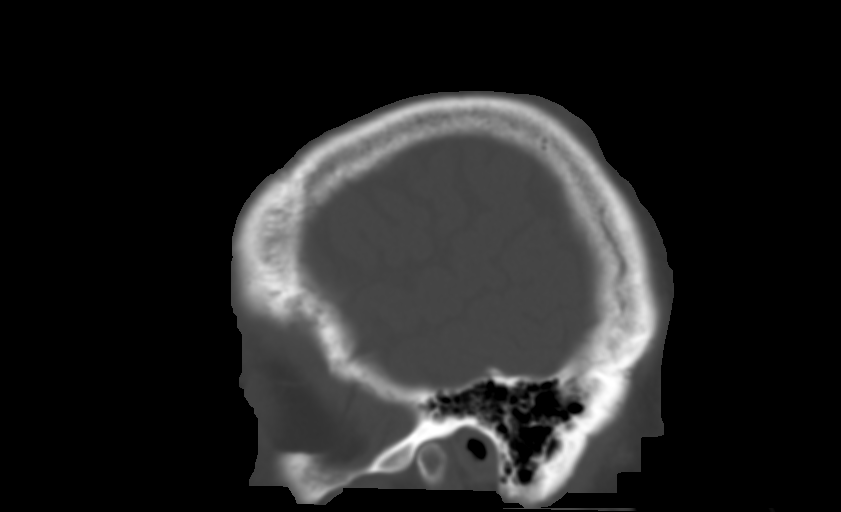
[im 20/59  bone]
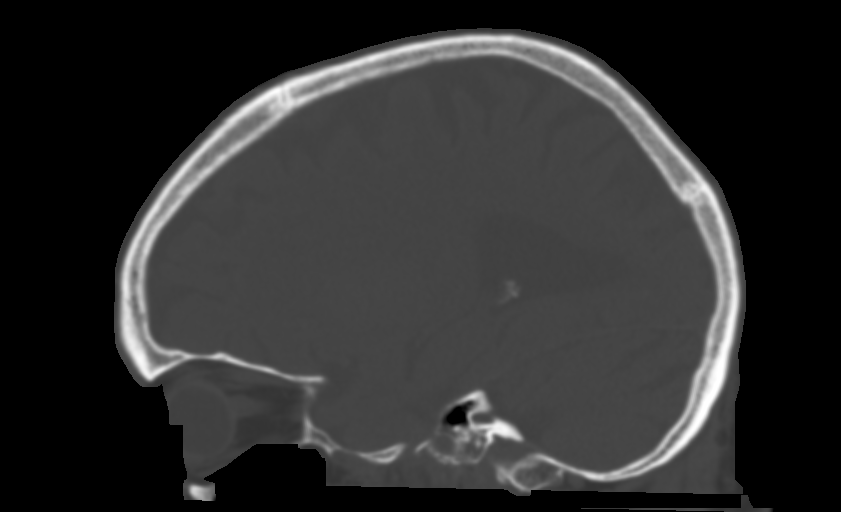
[im 30/59  bone]
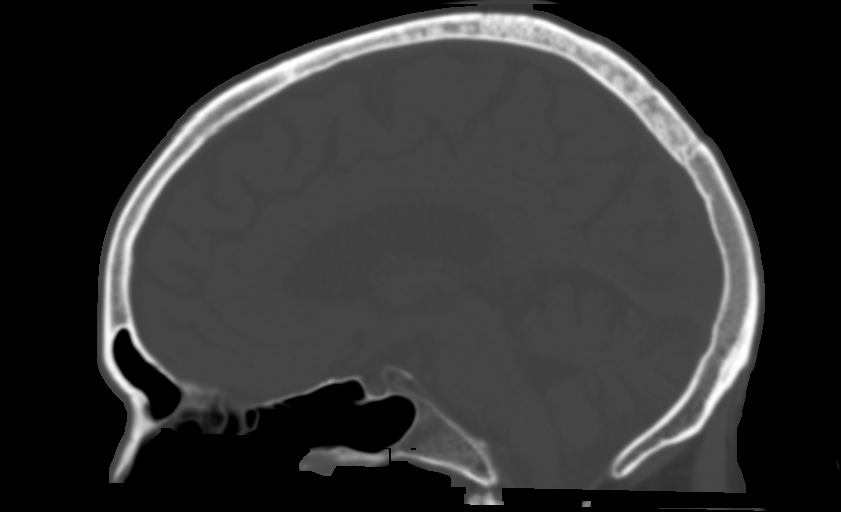
[im 39/59  bone]
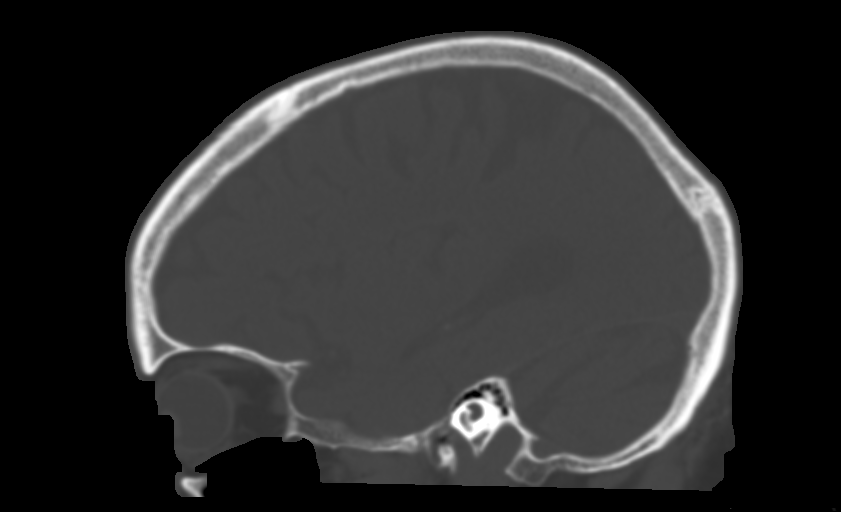
[im 49/59  bone]
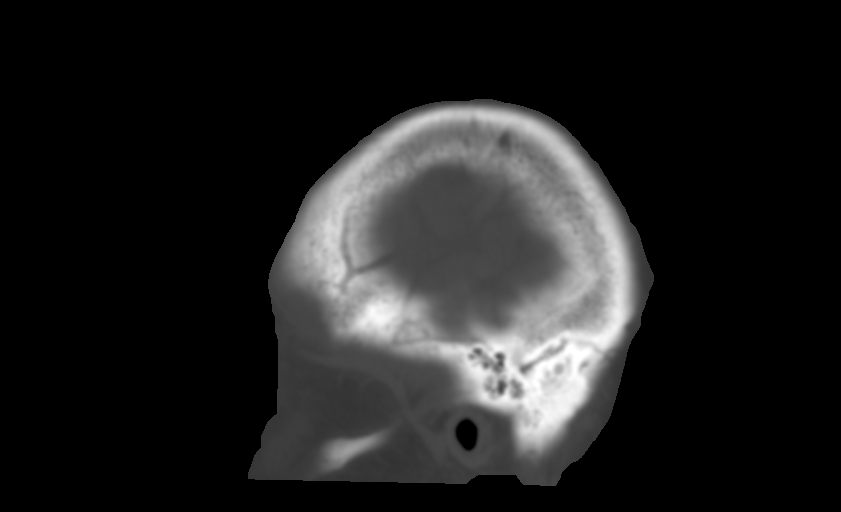

[Series 8: c-spine st · axial · 0.25mm/px · z∈[-202,-152]mm · 2 of 77 slices shown]
[im 26/77  bone]
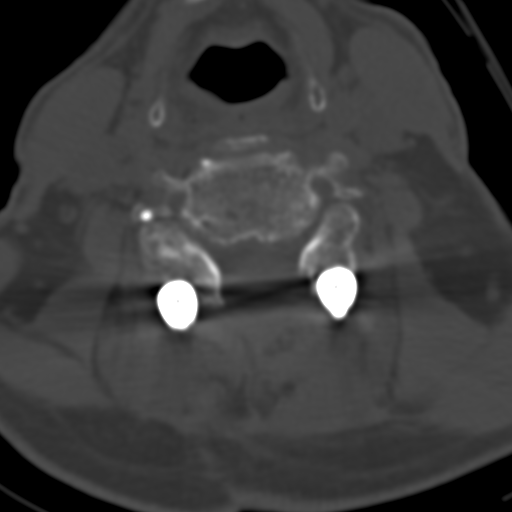
[im 51/77  bone]
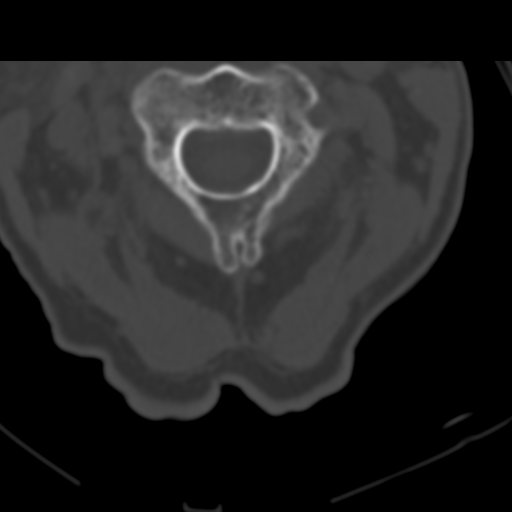

[Series 11: axial recon · axial · 0.23mm/px · z∈[-240,-162]mm · 3 of 85 slices shown, 4 images]
[im 22/85  soft-tissue]
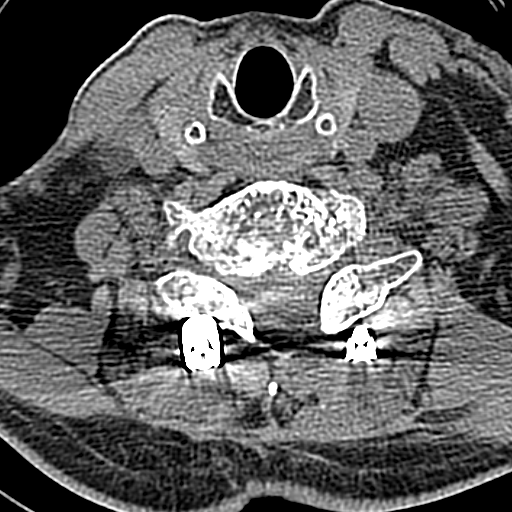
[im 22/85  bone]
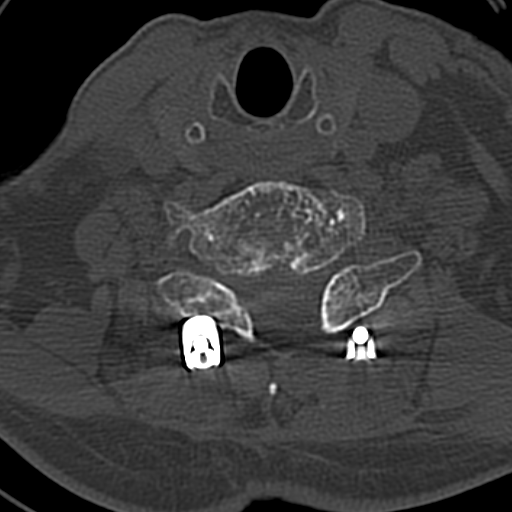
[im 43/85  bone]
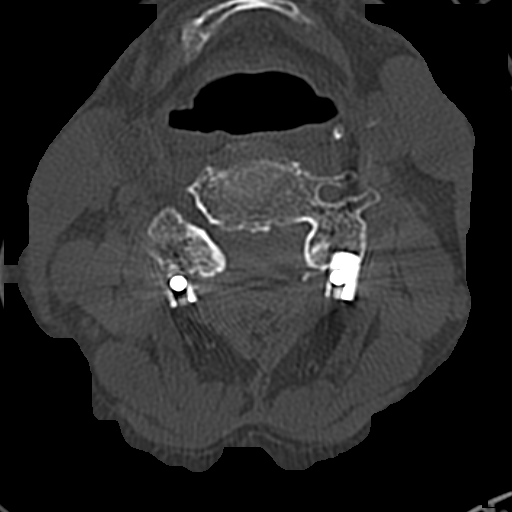
[im 64/85  bone]
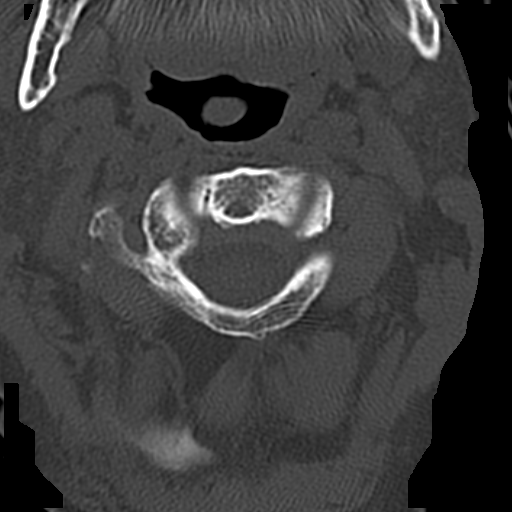

[11 of 33 positions shown; findings below may reference images not displayed]

FINDINGS: CT HEAD FINDINGS

No skull fracture or intracranial hemorrhage.

Mild chronic microvascular changes without CT evidence of large
acute infarct.

Mild global atrophy without hydrocephalus.

No intracranial mass lesion noted on this unenhanced exam.

No acute orbital abnormality.

Visualize mastoid air cells, mid middle ear cavities and paranasal
sinuses are clear.

Vascular calcifications.

CT CERVICAL SPINE FINDINGS

Post decompression and fusion from a posterior approach C3 through
C7. There may be loosening of the left C7 screw.

Left C3 screw extends partially into the transverse foramen.

No acute cervical spine fracture or abnormal prevertebral soft
tissue swelling.

Carotid bifurcation calcifications.

Visualized lung apices are clear.
IMPRESSION: CT HEAD

No skull fracture or intracranial hemorrhage.

Mild chronic microvascular changes without CT evidence of large
acute infarct.

Mild global atrophy without hydrocephalus.

CT CERVICAL SPINE

Post decompression and fusion from a posterior approach C3 through
C7. There may be loosening of the left C7 screw.

No acute cervical spine fracture or abnormal prevertebral soft
tissue swelling.

## 2018-12-19 DEATH — deceased

## 2019-02-15 IMAGING — DX DG RIBS W/ CHEST 3+V*L*
3 series · 3 of 3 positions shown · non-contrast
Comparison: None.

CLINICAL DATA: Patient fell down a flight of stairs.  Pain.

EXAM:
LEFT RIBS AND CHEST - 3+ VIEW

[chest pa]
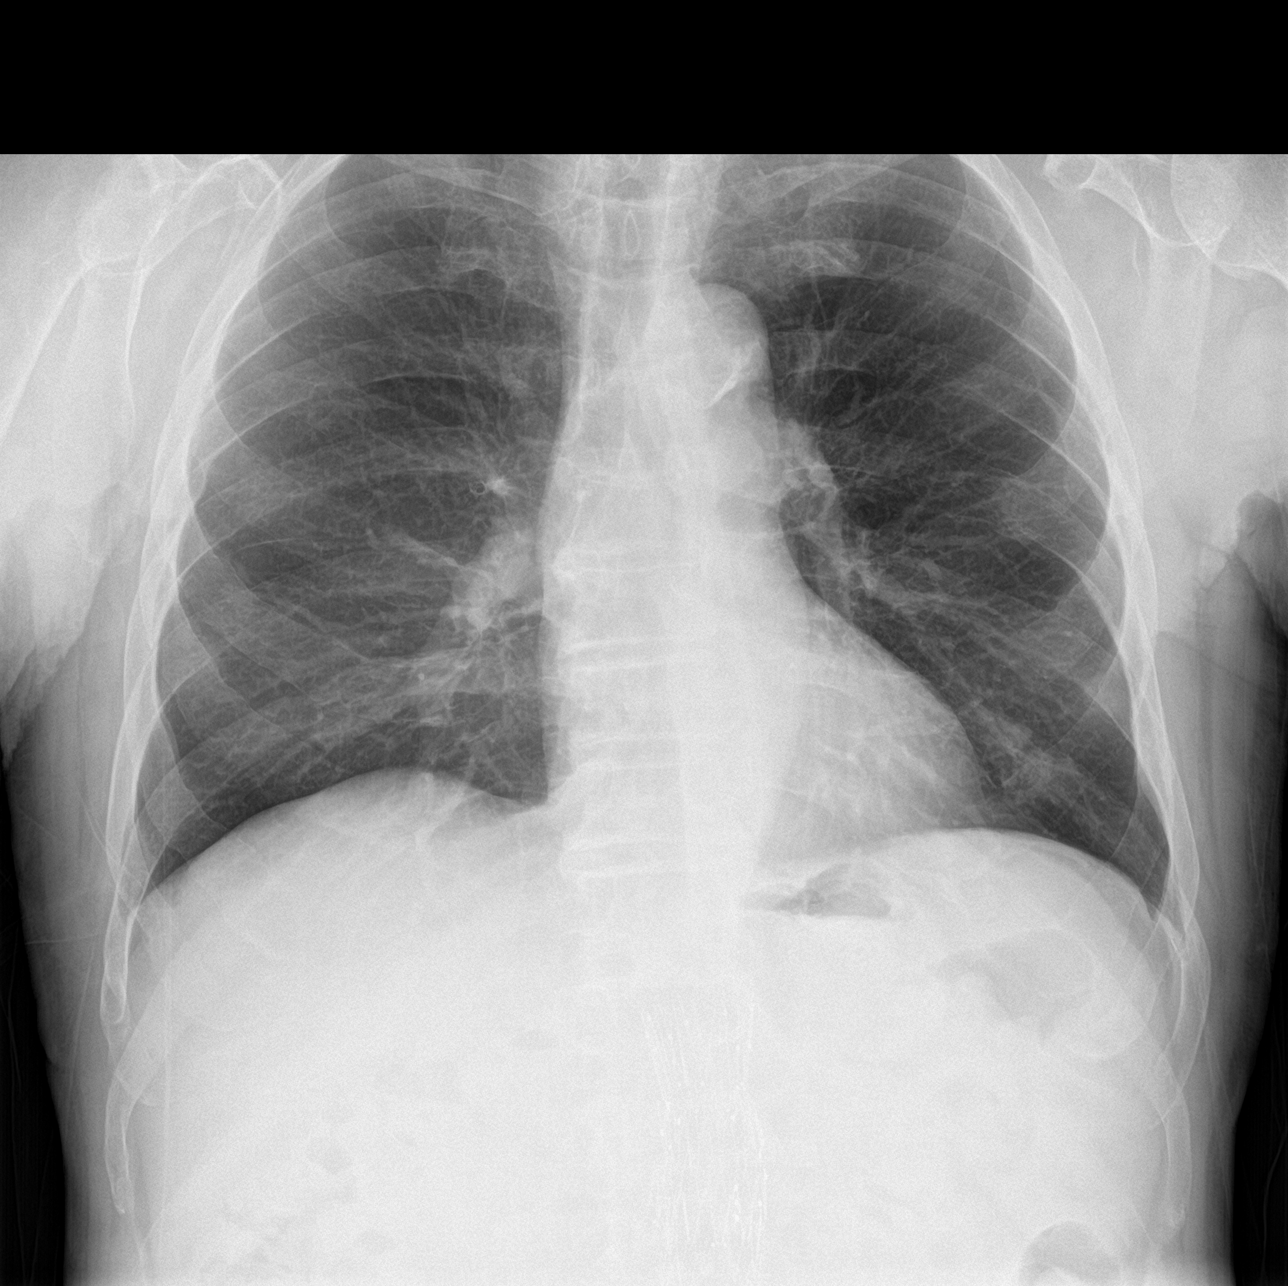

[rib pa]
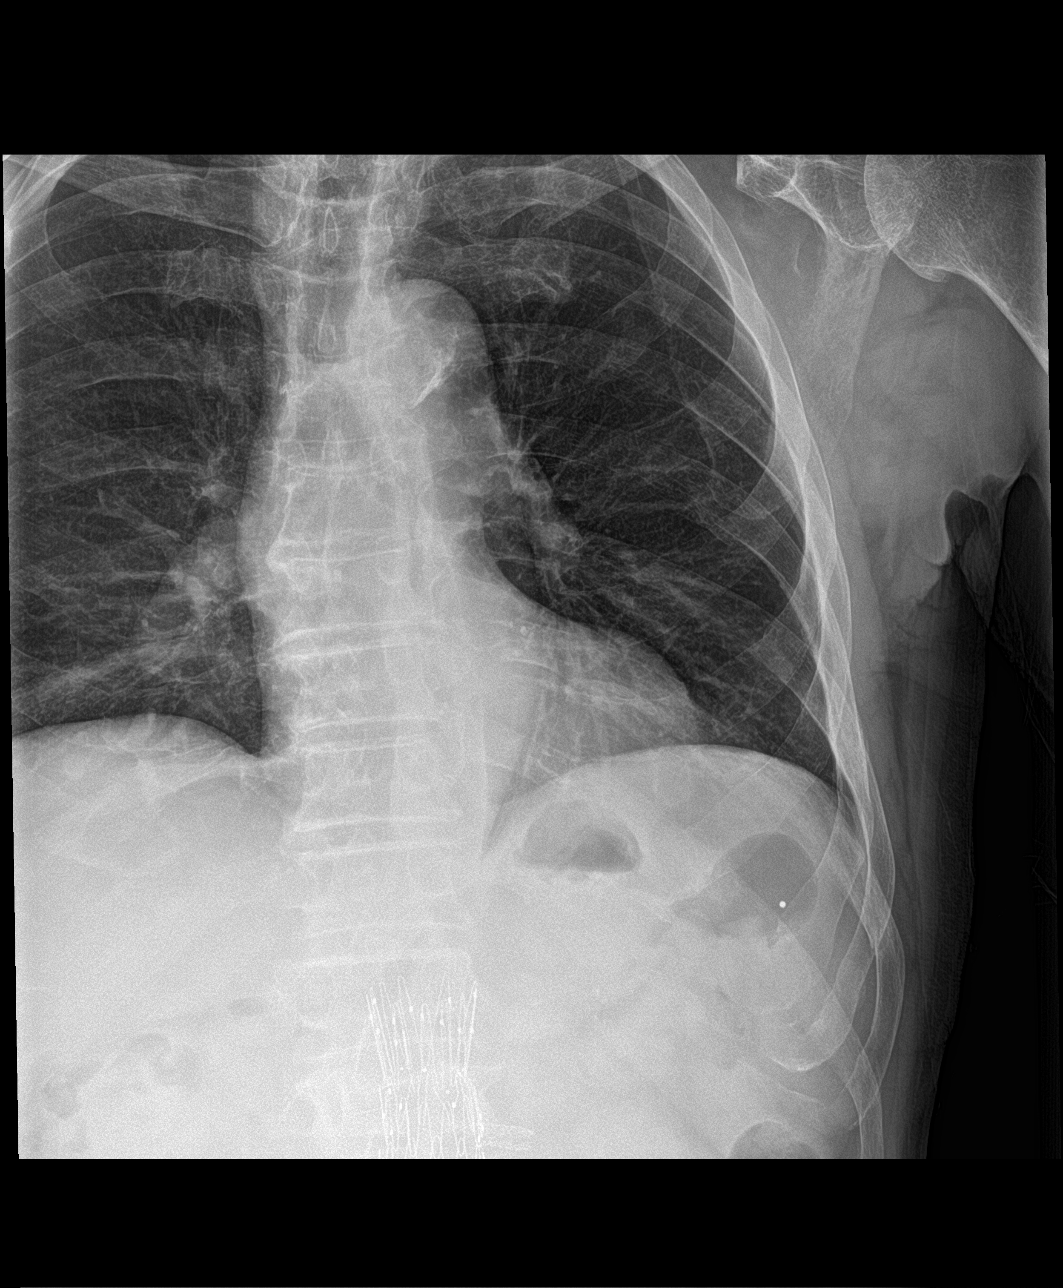

[rib pa obl]
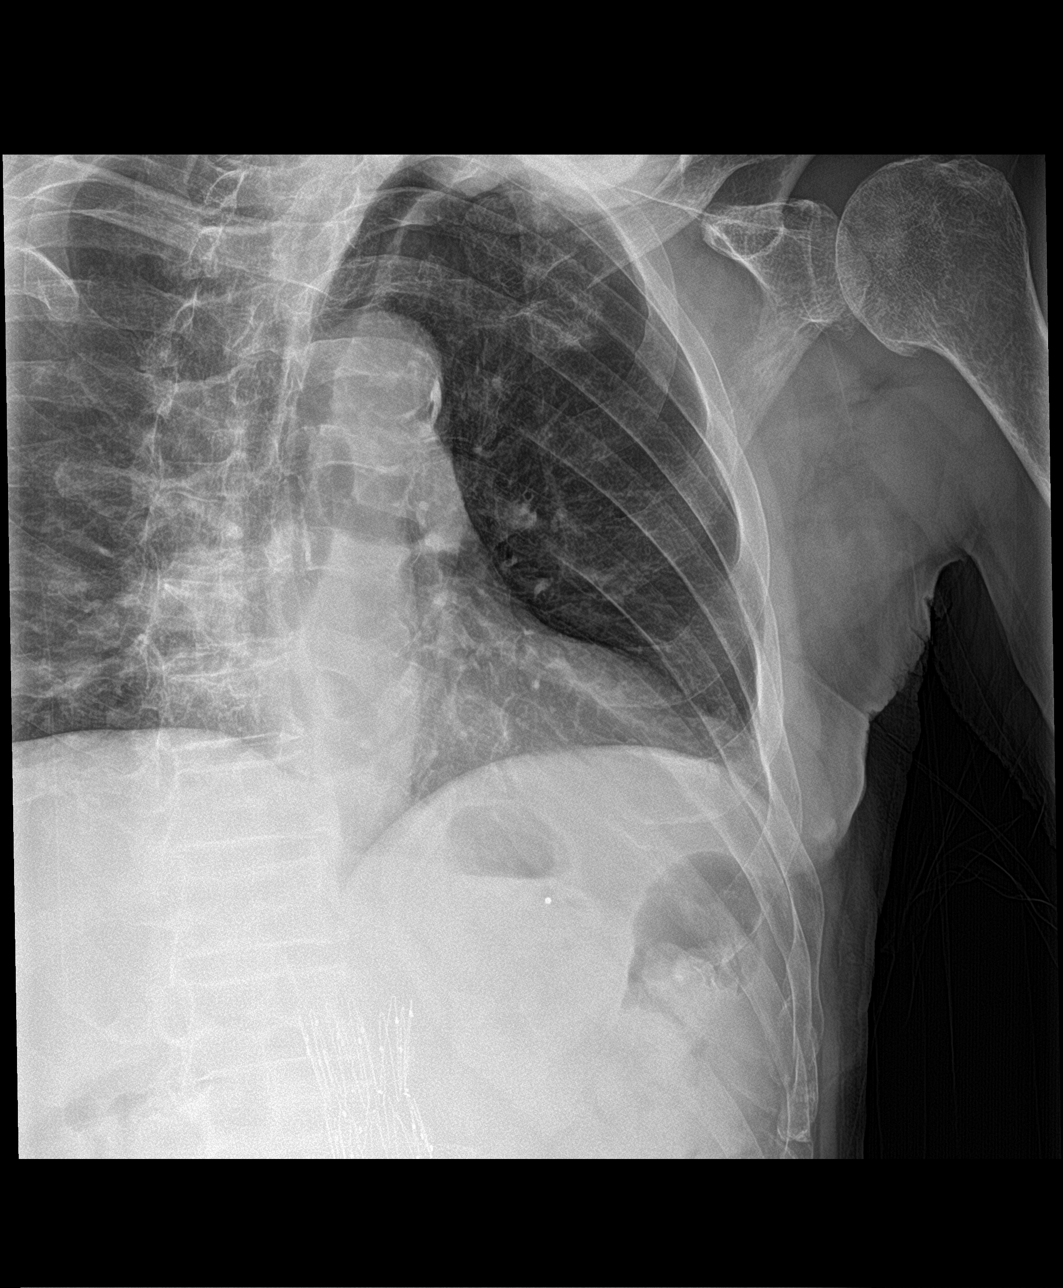

[3 of 3 positions shown; findings below may reference images not displayed]

FINDINGS: There are minimally displaced fractures of the LEFT lateral ninth
and tenth ribs. There is no evidence of pneumothorax or pleural
effusion. Both lungs are clear. Heart size and mediastinal contours
are within normal limits. Calcified tortuous aorta.
IMPRESSION: Minimally displaced fractures of the LEFT ninth and tenth ribs. No
pneumothorax.
